# Patient Record
Sex: Male | Born: 1950 | Race: Black or African American | Hispanic: No | Marital: Single | Smoking: Former smoker
Health system: Southern US, Community
[De-identification: ages and names within clinical notes are randomized; demographics above are authoritative.]

## PROBLEM LIST (undated history)

## (undated) DIAGNOSIS — C259 Malignant neoplasm of pancreas, unspecified: Secondary | ICD-10-CM

## (undated) DIAGNOSIS — C3491 Malignant neoplasm of unspecified part of right bronchus or lung: Secondary | ICD-10-CM

## (undated) DIAGNOSIS — R17 Unspecified jaundice: Secondary | ICD-10-CM

## (undated) DIAGNOSIS — R918 Other nonspecific abnormal finding of lung field: Secondary | ICD-10-CM

## (undated) DIAGNOSIS — K639 Disease of intestine, unspecified: Secondary | ICD-10-CM

## (undated) HISTORY — DX: Malignant neoplasm of pancreas, unspecified: C25.9

## (undated) HISTORY — DX: Malignant neoplasm of unspecified part of right bronchus or lung: C34.91

---

## 2017-01-21 ENCOUNTER — Encounter (HOSPITAL_COMMUNITY): Payer: Self-pay | Admitting: Family Medicine

## 2017-01-21 ENCOUNTER — Ambulatory Visit (HOSPITAL_COMMUNITY)
Admission: EM | Admit: 2017-01-21 | Discharge: 2017-01-21 | Disposition: A | Discharge status: Home / Self Care | Payer: BC Managed Care – PPO | Attending: Internal Medicine | Admitting: Internal Medicine

## 2017-01-21 DIAGNOSIS — K5900 Constipation, unspecified: Secondary | ICD-10-CM

## 2017-01-21 DIAGNOSIS — R1033 Periumbilical pain: Secondary | ICD-10-CM

## 2017-01-21 MED ORDER — POLYETHYLENE GLYCOL 3350 17 GM/SCOOP PO POWD: 1.0000 | Freq: Once | ORAL | 0 refills | Status: AC

## 2017-01-21 NOTE — ED Triage Notes (Signed)
Pt here for abd pain and constipation. Reports that he had a BM about 9 am and was very small amount. Denies N,V,D

## 2017-01-21 NOTE — Discharge Instructions (Signed)
Please establish with a primary care provider for continued evaluation of umbilical pain as further imaging or work up may be necessary if it does not resolve. If it becomes larger, more painful, red, or develop nausea, vomiting, fevers please return to be seen. Please try miralax powder daily until bowel movements have become soft and regular for you. May titrate as needed.

## 2017-01-21 NOTE — ED Provider Notes (Signed)
[place]    CSN: [id number] Arrival date & time: 01/21/17  1203     History   Chief Complaint Chief Complaint  Patient presents with  . Abdominal Pain  . Constipation    HPI Fred Bradford is a 66 y.o. male.   Fred Bradford presents with complaints of difficulty passing stool and pain near his umbilicus. He noted the area to his umbilicus approximately 4 days ago when he was pushing on his stomach. He feels there was an area of protrusion, which becomes irritated if his pants are on it. He states he has had to really strain to pass a BM for the past 6 days. Did have a BM today but states it was hard and small amount. Denies current pain. Denies nausea, vomiting or diarrhea. Denies any medical history, does not smoke, does not take any medications. Denies previous similar. Denies urinary symptoms.    ROS per HPI.       History reviewed. No pertinent past medical history.  There are no active problems to display for this patient.   History reviewed. No pertinent surgical history.     Home Medications    Prior to Admission medications   Medication Sig Start Date End Date Taking? Authorizing Provider  polyethylene glycol powder (MIRALAX) powder Take 255 g once for 1 dose by mouth. 01/21/17 01/21/17  Zigmund Gottron, NP    Family History Family History  Family history unknown: Yes    Social History Social History   Tobacco Use  . Smoking status: Former Research scientist (life sciences)  . Smokeless tobacco: Never Used  Substance Use Topics  . Alcohol use: Not on file  . Drug use: Not on file     Allergies   Patient has no known allergies.   Review of Systems Review of Systems   Physical Exam Triage Vital Signs ED Triage Vitals  Enc Vitals Group     BP 01/21/17 1235 (!) 148/79     Pulse Rate 01/21/17 1235 77     Resp 01/21/17 1235 18     Temp 01/21/17 1235 98.6 F (37 C)     Temp src --      SpO2 01/21/17 1235 100 %     Weight --      Height --    Head Circumference --      Peak Flow --      Pain Score 01/21/17 1232 4     Pain Loc --      Pain Edu? --      Excl. in [place]? --    No data found.  Updated Vital Signs BP (!) 148/79   Pulse 77   Temp 98.6 F (37 C)   Resp 18   SpO2 100%   Visual Acuity Right Eye Distance:   Left Eye Distance:   Bilateral Distance:    Right Eye Near:   Left Eye Near:    Bilateral Near:     Physical Exam  Constitutional: He is oriented to person, place, and time. He appears well-developed and well-nourished.  Cardiovascular: Normal rate and regular rhythm.  Pulmonary/Chest: Effort normal and breath sounds normal.  Abdominal: Normal appearance. There is no tenderness.    Small soft mass noted with deep palpation; denies tenderness; right of midline; does not reduce with pressure  Neurological: He is alert and oriented to person, place, and time.  Skin: Skin is warm and dry.     UC Treatments / Results  Labs (all labs ordered  are listed, but only abnormal results are displayed) Labs Reviewed - No data to display  EKG  EKG Interpretation None       Radiology No results found.  Procedures Procedures (including critical care time)  Medications Ordered in UC Medications - No data to display   Initial Impression / Assessment and Plan / UC Course  I have reviewed the triage vital signs and the nursing notes.  Pertinent labs & imaging results that were available during my care of the patient were reviewed by me and considered in my medical decision making (see chart for details).     Umbilical region with small soft palpable mass noted, non tender; without redness. Discussed hernia vs mass vs natural body habitus as patient states he has just not noticed this before. Recommended establish with PCP for continued monitoring of this area, as may need further imaging in the future if it continues to be uncomfortable or if it worsens. miralax to help with constipation, push fluids.  Patient verbalized understanding and agreeable to plan.    Final Clinical Impressions(s) / UC Diagnoses   Final diagnoses:  Constipation, unspecified constipation type  Umbilical pain    ED Discharge Orders        Ordered    polyethylene glycol powder (MIRALAX) powder   Once     01/21/17 1252       Controlled Substance Prescriptions [place] Controlled Substance Registry consulted? Not Applicable   Zigmund Gottron, NP 01/21/17 1310

## 2017-02-15 ENCOUNTER — Other Ambulatory Visit: Payer: Self-pay

## 2017-02-15 ENCOUNTER — Inpatient Hospital Stay (HOSPITAL_COMMUNITY): Payer: BC Managed Care – PPO

## 2017-02-15 ENCOUNTER — Encounter (HOSPITAL_COMMUNITY): Payer: Self-pay

## 2017-02-15 ENCOUNTER — Emergency Department (HOSPITAL_COMMUNITY): Payer: BC Managed Care – PPO

## 2017-02-15 ENCOUNTER — Observation Stay (HOSPITAL_COMMUNITY)
Admission: EM | Admit: 2017-02-15 | Discharge: 2017-02-17 | Disposition: A | Discharge status: Home / Self Care | Payer: BC Managed Care – PPO | Attending: Family Medicine | Admitting: Family Medicine

## 2017-02-15 DIAGNOSIS — R1084 Generalized abdominal pain: Secondary | ICD-10-CM

## 2017-02-15 DIAGNOSIS — M419 Scoliosis, unspecified: Secondary | ICD-10-CM | POA: Insufficient documentation

## 2017-02-15 DIAGNOSIS — Z87891 Personal history of nicotine dependence: Secondary | ICD-10-CM | POA: Insufficient documentation

## 2017-02-15 DIAGNOSIS — K8689 Other specified diseases of pancreas: Secondary | ICD-10-CM | POA: Diagnosis present

## 2017-02-15 DIAGNOSIS — R0989 Other specified symptoms and signs involving the circulatory and respiratory systems: Secondary | ICD-10-CM

## 2017-02-15 DIAGNOSIS — Z79899 Other long term (current) drug therapy: Secondary | ICD-10-CM | POA: Diagnosis not present

## 2017-02-15 DIAGNOSIS — K5900 Constipation, unspecified: Secondary | ICD-10-CM | POA: Insufficient documentation

## 2017-02-15 DIAGNOSIS — G4700 Insomnia, unspecified: Secondary | ICD-10-CM | POA: Diagnosis not present

## 2017-02-15 DIAGNOSIS — K869 Disease of pancreas, unspecified: Principal | ICD-10-CM | POA: Insufficient documentation

## 2017-02-15 DIAGNOSIS — K862 Cyst of pancreas: Secondary | ICD-10-CM | POA: Insufficient documentation

## 2017-02-15 DIAGNOSIS — I700 Atherosclerosis of aorta: Secondary | ICD-10-CM | POA: Diagnosis not present

## 2017-02-15 DIAGNOSIS — M5136 Other intervertebral disc degeneration, lumbar region: Secondary | ICD-10-CM | POA: Diagnosis not present

## 2017-02-15 DIAGNOSIS — N281 Cyst of kidney, acquired: Secondary | ICD-10-CM | POA: Insufficient documentation

## 2017-02-15 LAB — COMPREHENSIVE METABOLIC PANEL
ALBUMIN: 3.8 g/dL (ref 3.5–5.0)
ALT: 39 U/L (ref 17–63)
AST: 31 U/L (ref 15–41)
Alkaline Phosphatase: 125 U/L (ref 38–126)
Anion gap: 7 (ref 5–15)
BUN: 17 mg/dL (ref 6–20)
CHLORIDE: 102 mmol/L (ref 101–111)
CO2: 27 mmol/L (ref 22–32)
Calcium: 9.3 mg/dL (ref 8.9–10.3)
Creatinine, Ser: 0.94 mg/dL (ref 0.61–1.24)
GFR calc Af Amer: >60 mL/min (ref >60)
GFR calc non Af Amer: >60 mL/min (ref >60)
GLUCOSE: 116 mg/dL — AB (ref 65–99)
POTASSIUM: 3.6 mmol/L (ref 3.5–5.1)
SODIUM: 136 mmol/L (ref 135–145)
Total Bilirubin: 0.7 mg/dL (ref 0.3–1.2)
Total Protein: 7.6 g/dL (ref 6.5–8.1)

## 2017-02-15 LAB — CBC
HEMATOCRIT: 42.1 % (ref 39.0–52.0)
Hemoglobin: 14 g/dL (ref 13.0–17.0)
MCH: 28.6 pg (ref 26.0–34.0)
MCHC: 33.3 g/dL (ref 30.0–36.0)
MCV: 85.9 fL (ref 78.0–100.0)
Platelets: 350 10*3/uL (ref 150–400)
RBC: 4.9 MIL/uL (ref 4.22–5.81)
RDW: 14 % (ref 11.5–15.5)
WBC: 8.3 10*3/uL (ref 4.0–10.5)

## 2017-02-15 LAB — URINALYSIS, ROUTINE W REFLEX MICROSCOPIC
BACTERIA UA: NONE SEEN
Bilirubin Urine: NEGATIVE
Glucose, UA: NEGATIVE mg/dL
HGB URINE DIPSTICK: NEGATIVE
KETONES UR: NEGATIVE mg/dL
LEUKOCYTES UA: NEGATIVE
Nitrite: NEGATIVE
PROTEIN: 30 mg/dL — AB
Specific Gravity, Urine: 1.023 (ref 1.005–1.030)
pH: 6 (ref 5.0–8.0)

## 2017-02-15 LAB — LIPASE, BLOOD: LIPASE: 31 U/L (ref 11–51)

## 2017-02-15 MED ORDER — ACETAMINOPHEN 650 MG RE SUPP: 650.0000 mg | Freq: Four times a day (QID) | RECTAL | Status: DC | PRN

## 2017-02-15 MED ORDER — ACETAMINOPHEN 325 MG PO TABS
650.0000 mg | ORAL_TABLET | Freq: Four times a day (QID) | ORAL | Status: DC | PRN
Start: 2017-02-15 — End: 2017-02-17
  Administered 2017-02-16: 650 mg via ORAL
  Filled 2017-02-15: qty 2

## 2017-02-15 MED ORDER — IOPAMIDOL (ISOVUE-300) INJECTION 61%
INTRAVENOUS | Status: AC
  Administered 2017-02-15: 100 mL via INTRAVENOUS
  Filled 2017-02-15: qty 100

## 2017-02-15 MED ORDER — SENNA 8.6 MG PO TABS
1.0000 | ORAL_TABLET | Freq: Two times a day (BID) | ORAL | Status: DC
  Administered 2017-02-15 – 2017-02-17 (×4): 8.6 mg via ORAL
  Filled 2017-02-15 (×4): qty 1

## 2017-02-15 MED ORDER — POLYETHYLENE GLYCOL 3350 17 G PO PACK: 17.0000 g | PACK | Freq: Every day | ORAL | Status: DC | PRN

## 2017-02-15 MED ORDER — ENOXAPARIN SODIUM 30 MG/0.3ML ~~LOC~~ SOLN
30.0000 mg | SUBCUTANEOUS | Status: DC
  Filled 2017-02-15: qty 0.3

## 2017-02-15 MED ORDER — RAMELTEON 8 MG PO TABS
8.0000 mg | ORAL_TABLET | Freq: Every day | ORAL | Status: DC
  Administered 2017-02-15 – 2017-02-16 (×2): 8 mg via ORAL
  Filled 2017-02-15 (×3): qty 1

## 2017-02-15 MED ORDER — MORPHINE SULFATE (PF) 4 MG/ML IV SOLN
4.0000 mg | Freq: Once | INTRAVENOUS | Status: AC
  Administered 2017-02-15: 4 mg via INTRAVENOUS
  Filled 2017-02-15: qty 1

## 2017-02-15 MED ORDER — ONDANSETRON HCL 4 MG PO TABS: 4.0000 mg | ORAL_TABLET | Freq: Four times a day (QID) | ORAL | Status: DC | PRN

## 2017-02-15 MED ORDER — IBUPROFEN 600 MG PO TABS: 600.0000 mg | ORAL_TABLET | Freq: Four times a day (QID) | ORAL | Status: DC | PRN

## 2017-02-15 MED ORDER — ONDANSETRON HCL 4 MG/2ML IJ SOLN: 4.0000 mg | Freq: Four times a day (QID) | INTRAMUSCULAR | Status: DC | PRN

## 2017-02-15 MED ORDER — DICYCLOMINE HCL 10 MG PO CAPS
10.0000 mg | ORAL_CAPSULE | Freq: Once | ORAL | Status: AC
  Administered 2017-02-15: 10 mg via ORAL
  Filled 2017-02-15: qty 1

## 2017-02-15 MED ORDER — GADOBENATE DIMEGLUMINE 529 MG/ML IV SOLN
14.0000 mL | Freq: Once | INTRAVENOUS | Status: AC
  Administered 2017-02-15: 14 mL via INTRAVENOUS

## 2017-02-15 MED ORDER — ENOXAPARIN SODIUM 40 MG/0.4ML ~~LOC~~ SOLN
40.0000 mg | SUBCUTANEOUS | Status: DC
  Administered 2017-02-15: 40 mg via SUBCUTANEOUS
  Filled 2017-02-15: qty 0.4

## 2017-02-15 MED ORDER — SODIUM CHLORIDE 0.9 % IV BOLUS (SEPSIS)
1000.0000 mL | Freq: Once | INTRAVENOUS | Status: AC
  Administered 2017-02-15: 1000 mL via INTRAVENOUS

## 2017-02-15 NOTE — ED Provider Notes (Signed)
Emergency Department Provider Note   I have reviewed the triage vital signs and the nursing notes.   HISTORY  Chief Complaint Abdominal Pain   HPI Fred Bradford is a 66 y.o. male since to the emergency department for evaluation of abdominal pain.  Symptoms have been ongoing for the past month.  He describes worsening pain.  Pain is located near the umbilicus.  No fevers or chills.  Patient reports unintentional weight loss. No radiation of symptoms. No modifying factors.    History reviewed. No pertinent past medical history.  Patient Active Problem List   Diagnosis Date Noted  . Pancreatic mass 02/15/2017    History reviewed. No pertinent surgical history.    Allergies Patient has no known allergies.  Family History  Family history unknown: Yes    Social History Social History   Tobacco Use  . Smoking status: Former Research scientist (life sciences)  . Smokeless tobacco: Never Used  Substance Use Topics  . Alcohol use: Yes  . Drug use: No    Review of Systems  Constitutional: No fever/chills Eyes: No visual changes. ENT: No sore throat. Cardiovascular: Denies chest pain. Respiratory: Denies shortness of breath. Gastrointestinal: Positive abdominal pain.  No nausea, no vomiting.  No diarrhea.  No constipation. Genitourinary: Negative for dysuria. Musculoskeletal: Negative for back pain. Skin: Negative for rash. Neurological: Negative for headaches, focal weakness or numbness.  10-point ROS otherwise negative.  ____________________________________________   PHYSICAL EXAM:  VITAL SIGNS: ED Triage Vitals  Enc Vitals Group     BP 02/15/17 1224 95/61     Pulse Rate 02/15/17 1224 (!) 120     Resp 02/15/17 1224 18     Temp 02/15/17 1224 98.8 F (37.1 C)     Temp Source 02/15/17 1224 Oral     SpO2 02/15/17 1224 99 %     Weight 02/15/17 1225 145 lb (65.8 kg)     Height 02/15/17 1225 5\' 9"  (1.753 m)     Pain Score 02/15/17 1224 6   Constitutional: Alert and oriented.  Well appearing and in no acute distress. Eyes: Conjunctivae are normal. Head: Atraumatic. Nose: No congestion/rhinnorhea. Mouth/Throat: Mucous membranes are moist. Neck: No stridor.  Cardiovascular: Normal rate, regular rhythm. Good peripheral circulation. Grossly normal heart sounds.   Respiratory: Normal respiratory effort.  No retractions. Lungs CTAB. Gastrointestinal: Soft with focal periumbilical abdominal tenderness. No rebound or guarding. No distention.  Musculoskeletal: No lower extremity tenderness nor edema. No gross deformities of extremities. Neurologic:  Normal speech and language. No gross focal neurologic deficits are appreciated.  Skin:  Skin is warm, dry and intact. No rash noted.  ____________________________________________   LABS (all labs ordered are listed, but only abnormal results are displayed)  Labs Reviewed  COMPREHENSIVE METABOLIC PANEL - Abnormal; Notable for the following components:      Result Value   Glucose, Bld 116 (*)    All other components within normal limits  URINALYSIS, ROUTINE W REFLEX MICROSCOPIC - Abnormal; Notable for the following components:   Color, Urine AMBER (*)    APPearance HAZY (*)    Protein, ur 30 (*)    Squamous Epithelial / LPF 0-5 (*)    All other components within normal limits  COMPREHENSIVE METABOLIC PANEL - Abnormal; Notable for the following components:   Sodium 133 (*)    Glucose, Bld 109 (*)    Calcium 8.8 (*)    Total Protein 6.3 (*)    Albumin 3.2 (*)    All other components within  normal limits  CBC - Abnormal; Notable for the following components:   Hemoglobin 12.9 (*)    All other components within normal limits  LIPASE, BLOOD  CBC   ____________________________________________  RADIOLOGY  X-ray Chest Pa And Lateral  Result Date: 02/15/2017 CLINICAL DATA:  Bilateral lower lobe ronchi EXAM: CHEST  2 VIEW COMPARISON:  None. FINDINGS: The heart size and mediastinal contours are within normal limits.  Aortic atherosclerosis is noted at the arch. The lungs are mildly hyperinflated without pulmonary consolidation, CHF nor effusion. Minimal scarring at the left lung apex. The visualized skeletal structures are unremarkable. IMPRESSION: No active cardiopulmonary disease. Mild pulmonary hyperinflation. Aortic atherosclerosis. Electronically Signed   By: Ashley Royalty M.D.   On: 02/15/2017 22:43   Mr Abdomen W Wo Contrast  Result Date: 02/16/2017 CLINICAL DATA:  Paraumbilical pain and anorexia. Pancreatic lesion seen on previous CT. EXAM: MRI ABDOMEN WITHOUT AND WITH CONTRAST TECHNIQUE: Multiplanar multisequence MR imaging of the abdomen was performed both before and after the administration of intravenous contrast. CONTRAST:  51mL MULTIHANCE GADOBENATE DIMEGLUMINE 529 MG/ML IV SOLN COMPARISON:  CT on 02/15/2017 FINDINGS: Lower Chest: No acute findings. Hepatobiliary: No intrahepatic masses are identified. Intrahepatic biliary ductal dilatation is seen within left hepatic lobe which appears to be due to lymphadenopathy in the porta hepatis. Pancreas: A ill-defined hypovascular mass with central necrosis is seen in body which measures approximately 4.3 x 2.9 cm on image 24/3. This causes pancreatic ductal dilatation pancreatic tail. 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 Spleen: Within normal limits in size and appearance. Adrenals/Urinary Tract: No masses identified. Several tiny renal cysts noted bilaterally. No evidence of hydronephrosis. Stomach/Bowel: No evidence of obstruction, inflammatory process or abnormal fluid collections. Vascular/Lymphatic: Peripancreatic lymphadenopathy seen in the gastrohepatic and hepatoduodenal ligaments, and extending into porta hepatis. The largest area in the porta hepatis measuring 2.9 cm short axis on image 33/ 1204. This causes obstruction of intrahepatic bile ducts within the left lobe. Other:  None. Musculoskeletal:  No  suspicious bone lesions identified. 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 Bulky peripancreatic adenopathy with extension into the porta hepatis. This causes obstruction of left intrahepatic bile ducts. Electronically Signed   By: Earle Gell M.D.   On: 02/16/2017 08:29   Ct Abdomen Pelvis W Contrast  Result Date: 02/15/2017 CLINICAL DATA:  Periumbilical abdominal pain, constipation and decreased appetite. EXAM: CT ABDOMEN AND PELVIS WITH CONTRAST TECHNIQUE: Multidetector CT imaging of the abdomen and pelvis was performed using the standard protocol following bolus administration of intravenous contrast. CONTRAST:  166mL ISOVUE-300 IOPAMIDOL (ISOVUE-300) INJECTION 61% COMPARISON:  None. FINDINGS: Lower chest: Focal calcifications in the posterior left hemithorax appear to abut the pleural surface and are associated with some mild soft tissue thickening. This likely represents partially calcified pleural plaque versus adjacent calcified granulomata. Hepatobiliary: The liver appears unremarkable. No focal hepatic mass lesions or evidence of biliary dilatation. The gallbladder is contracted. Pancreas: There is a mass within the body of the pancreas measuring approximately 3.8 x 2.7 x 3.6 cm. This mass causes obstruction of the splenic vein and also encases a segment of the splenic artery. The mass also exerts mass effect on the confluence of the superior mesenteric and portal veins. The pancreatic tail shows atrophy with associated ductal dilatation. Spleen: The spleen is normal in size. Perisplenic short gastric collateral veins are seen  which eventually drain into an epigastric collateral varix that drains into the superior mesenteric vein. 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 Adrenals/Urinary Tract: No adrenal masses  identified. The kidneys appear unremarkable and show no evidence of hydronephrosis, masses or calculi. Ureters and bladder are unremarkable. Stomach/Bowel: No evidence of bowel obstruction. The stomach is mildly distended with fluid. Vascular/Lymphatic: There is soft tissue nodularity superior to the splenic artery and medial to the stomach. This may represent an enlarged gastrohepatic lymph node. Margination of this soft tissue is very vague due to lack of abdominal fat. The abdominal aorta and common iliac artery is demonstrate scattered calcified plaque without evidence of aneurysmal disease. The aorta is moderately tortuous. Reproductive: Prostate is unremarkable. Other: There may be a trace amount of ascites in the peritoneal cavity. It is difficult to delineate free-fluid due to lack of intra-abdominal fat. Musculoskeletal: Degenerative disc disease of the lumbar spine present with a chronic appearing compression fracture of the L2 vertebral body. Associated scoliosis. IMPRESSION: 1. Mass within the body of the pancreas measuring approximately 3.8 cm in greatest diameter. 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 The pancreatic mass causes distal atrophy and ductal dilatation of the tail of the pancreas. There may be metastatic spread to a gastrohepatic lymph node measuring approximately 2 cm. Margination of this potential metastatic lymph node is very difficult due to the lack of intra-abdominal fat and further soft tissue delineation may be possible with MRI of the abdomen. 2. Probable trace ascites in the peritoneal cavity. 3. Small adjacent calcifications along the subpleural/pleural left posterior hemithorax has a benign appearance and represents either 2 adjacent calcified  granulomata or partially calcified pleural plaque. Electronically Signed   By: Aletta Edouard M.D.   On: 02/15/2017 15:49    ____________________________________________   PROCEDURES  Procedure(s) performed:   Procedures  None ____________________________________________   INITIAL IMPRESSION / ASSESSMENT AND PLAN / ED COURSE  Pertinent labs & imaging results that were available during my care of the patient were reviewed by me and considered in my medical decision making (see chart for details).  Patient presents to the ED for evaluation of abdominal pain, weight loss, and nausea. Normal labs from triage but given focal tenderness on exam and uinintentional weight loss the patient was sent for CT which showed a pancreatic mass extending into the splenic and hepatic vasculature. Pain not well controlled in the ED so provided morphine. Patient with little to no consistent PCP follow up for outpatient w/u. Plan for admission for pain control and MRI of the abdomen to r/o hepatic obstruction/venous thrombosis.   Discussed patient's case with Family Medicine to request admission. Patient and family (if present) updated with plan. Care transferred to [facility] Medicine service.  I reviewed all nursing notes, vitals, pertinent old records, EKGs, labs, imaging (as available).  ____________________________________________  FINAL CLINICAL IMPRESSION(S) / ED DIAGNOSES  Final diagnoses:  Generalized abdominal pain  Pancreatic mass     MEDICATIONS GIVEN DURING THIS VISIT:  Medications  acetaminophen (TYLENOL) tablet 650 mg (not administered)    Or  acetaminophen (TYLENOL) suppository 650 mg (not administered)  ibuprofen (ADVIL,MOTRIN) tablet 600 mg (not administered)  senna (SENOKOT) tablet 8.6 mg (8.6 mg Oral Given 02/15/17 2216)  polyethylene glycol (MIRALAX / GLYCOLAX) packet 17 g (not administered)  ondansetron (ZOFRAN) tablet 4 mg (not administered)    Or  ondansetron (ZOFRAN)  injection 4 mg (not administered)  ramelteon (ROZEREM) tablet 8 mg (8 mg Oral Given 02/15/17 2216)  enoxaparin (LOVENOX) injection 40 mg (40 mg Subcutaneous Given 02/15/17 2216)  sodium chloride 0.9 % bolus 1,000 mL (0 mLs Intravenous Stopped 02/15/17 1637)  dicyclomine (BENTYL) capsule 10 mg (10 mg Oral Given 02/15/17 1452)  iopamidol (ISOVUE-300) 61 % injection (100 mLs Intravenous Contrast Given 02/15/17 1513)  morphine 4 MG/ML injection 4 mg (4 mg Intravenous Given 02/15/17 1618)  gadobenate dimeglumine (MULTIHANCE) injection 14 mL (14 mLs Intravenous Contrast Given 02/15/17 2230)    Note:  This document was prepared using Dragon voice recognition software and may include unintentional dictation errors.  Nanda Quinton, MD Emergency Medicine    Dearies Meikle, [facility], MD 02/16/17 [phone]

## 2017-02-15 NOTE — ED Notes (Signed)
Pt ambulated to room from waiting room, tolerated well. 

## 2017-02-15 NOTE — H&P (Signed)
[facility] Admission History and Physical Service Pager: [phone]  Patient name: Fred Bradford Medical record number: [id number] Date of birth: June 09, 1950 Age: 66 y.o. Gender: male  Primary Care Provider: Care, Jinny Blossom [facility] Consultants: Oncology Code Status: FULL  Chief Complaint: abdominal pain  Assessment and Plan: Fred Bradford is a 66 y.o. male presenting with abdominal pain. No significant PMH.   Abdominal Pain/Pancreatic Mass: Etiology is most likely pancreatic cancer. Patient is having vague abdominal symptoms possibly because it has been discovered early in the disease process. His abdominal discomfort and weight loss is consistent with pancreatic cancer. Other possible causes are pancreatic cyst or metastatic disease that originates from another source. These are highly unlikely due to the fact that the mass appears to be solid on CT scan and is invading the splenic vein and likely the artery as well. He also appears to have gastrohepatic lymph node involvement. Given the findings on CT it is less likely a cyst. Unlikely to be metastatic due to the fact that most cancers do not invade the pancreas without spreading to other sites first. However, it should be noted patient was a long time smoker (approximately 40 years) and has never had a colonoscopy. Although patient is having relatively benign symptoms and very few complaints overall prognosis is very poor given these recent findings. Lipase WNL and vital signs stable. Pain currently well-controlled after one dose morphine in ED.  - Admit to med-surg, attending physician Dr. Ardelia Mems - MRI of Abdomen and Pelvis - Consult Oncology in AM - Tylenol and Ibuprofen first line for abdominal pain contol. If pain continues to be uncontrolled consider Ketoralac, next step to tramadol. Consider morphine/opioid equivalents if these measures fail to control pain. Would like to avoid opioids as patient  has complained of constipation in the recent past. - Zofran PRN as needed for development of n/v - Senna and Miralax PRN constipation - Remain NPO while awaiting MRI  Tobacco Abuse Disorder: Patient expresses long history of tobacco use. Cigarettes. He states he quit smoking in 2017 after using "the gum". - Continue to monitor and order nicotine patch as needed  Insomnia: Patient endorses 3 weeks of difficulty sleeping since the abdominal discomfort began. He states he can lay down but he really doesn't sleep b/c it keeps him up at night. - Remelteon for sleep at night - Consider trazodone if no improvement but preferably use of hypnotics can be avoided.  FEN/GI: NPO Prophylaxis: Lovenox  Disposition: admit to FPTS  History of Present Illness:  Fred Bradford is a 66 y.o. male presenting with abdominal pain.  Patient presents with abdominal discomfort for about the past 5 weeks. Describes initially as an "uneasy feeling" when eating. Went to an urgent care facility on 11/15 after noticing a "bump" in his abdomen and continued discomfort. Says he was referred to a specialist, but was told there were no available appts until 12/21. Was also given Miralax at urgent care due to constipation, which he said was not effective.  Discomfort continued to worsen, to the point where patient decreased his PO intake due to "fear of getting full." Pain became so uncomfortable at night that he has not been able to sleep for about three weeks. 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 Noticed that he began to lose weight, he thinks about 10 pounds. He bought mag citrate at the pharmacy which he reports did improve his constipation, though he is still endorsing  constipation. Denies vomiting, early satiety, nausea, fevers, chills. Reports improvement in pain when applying pressure to mass.    Review Of Systems: Per HPI with the following additions:   Review of Systems   Constitutional: Positive for weight loss. Negative for chills and fever.  Respiratory: Negative for shortness of breath.   Cardiovascular: Negative for chest pain.  Gastrointestinal: Positive for abdominal pain and constipation. Negative for blood in stool, diarrhea, melena and vomiting.  Genitourinary: Negative for dysuria.  Neurological: Negative for dizziness and headaches.    Patient Active Problem List   Diagnosis Date Noted  . Pancreatic mass 02/15/2017    Past Medical History: History reviewed. No pertinent past medical history.  Past Surgical History: History reviewed. No pertinent surgical history.  Social History: Social History   Tobacco Use  . Smoking status: Former Research scientist (life sciences)  . Smokeless tobacco: Never Used  Substance Use Topics  . Alcohol use: Yes  . Drug use: No   Additional social history: Lives with his aunt. Works as a Development worker, international aid at [employer].  Former smoker. Quit 07/2015. Began smoking prior to age 14yo. ~5 beers/wk. No illicit drug use.  Please also refer to relevant sections of EMR.  Family History: Family History  Family history unknown: Yes   Denies any pertinent family medical history. No family history of cancer.   Allergies and Medications: No Known Allergies No current facility-administered medications on file prior to encounter.    Current Outpatient Medications on File Prior to Encounter  Medication Sig Dispense Refill  . magnesium citrate SOLN Take 1 Bottle by mouth once as needed for moderate constipation.      Objective: BP 119/65   Pulse 77   Temp 98.8 F (37.1 C) (Oral)   Resp 17   Ht 5\' 9"  (1.753 m)   Wt 145 lb (65.8 kg)   SpO2 100%   BMI 21.41 kg/m  Exam: General: Alert and Oriented x 3; NAD HEENT: NCAT, EOMI, PERRLA, non-erythematous moist pharyngeal mucosa, no exudates Cardiovascular: RRR, normal S1, S2, no murmurs Respiratory: rhonchorus breath sounds in lower lung bases with mild end-expiratory wheezes bilaterally, no  crackles Gastrointestinal: non-distended, soft, non-tender, +bs in all quadrants, no hepatomegaly MSK: FROM in all extremities Derm: warm, dry, intact, no rashes Neuro: CN II-XII grossly intact  Labs and Imaging: CBC BMET  Recent Labs  Lab 02/15/17 1232  WBC 8.3  HGB 14.0  HCT 42.1  PLT 350   Recent Labs  Lab 02/15/17 1232  NA 136  K 3.6  CL 102  CO2 27  BUN 17  CREATININE 0.94  GLUCOSE 116*  CALCIUM 9.3     12/10 - Lipase: 31 12/10 - U/A: 30mg /dL protein in urine, 0-5 squamous cells, hyaline casts; othewise wnl  Ct Abdomen Pelvis W Contrast  Result Date: 02/15/2017 CLINICAL DATA:  Periumbilical abdominal pain, constipation and decreased appetite. EXAM: CT ABDOMEN AND PELVIS WITH CONTRAST TECHNIQUE: Multidetector CT imaging of the abdomen and pelvis was performed using the standard protocol following bolus administration of intravenous contrast. CONTRAST:  122mL ISOVUE-300 IOPAMIDOL (ISOVUE-300) INJECTION 61% COMPARISON:  None. FINDINGS: Lower chest: Focal calcifications in the posterior left hemithorax appear to abut the pleural surface and are associated with some mild soft tissue thickening. This likely represents partially calcified pleural plaque versus adjacent calcified granulomata. Hepatobiliary: The liver appears unremarkable. No focal hepatic mass lesions or evidence of biliary dilatation. The gallbladder is contracted. Pancreas: There is a mass within the body of the pancreas measuring approximately 3.8  x 2.7 x 3.6 cm. This mass causes obstruction of the splenic vein and also encases a segment of the splenic artery. The mass also exerts mass effect on the confluence of the superior mesenteric and portal veins. The pancreatic tail shows atrophy with associated ductal dilatation. Spleen: The spleen is normal in size. Perisplenic short gastric collateral veins are seen which eventually drain into an epigastric collateral varix that drains into the superior mesenteric vein.  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 Adrenals/Urinary Tract: No adrenal masses identified. The kidneys appear unremarkable and show no evidence of hydronephrosis, masses or calculi. Ureters and bladder are unremarkable. Stomach/Bowel: No evidence of bowel obstruction. The stomach is mildly distended with fluid. Vascular/Lymphatic: There is soft tissue nodularity superior to the splenic artery and medial to the stomach. This may represent an enlarged gastrohepatic lymph node. Margination of this soft tissue is very vague due to lack of abdominal fat. The abdominal aorta and common iliac artery is demonstrate scattered calcified plaque without evidence of aneurysmal disease. The aorta is moderately tortuous. Reproductive: Prostate is unremarkable. Other: There may be a trace amount of ascites in the peritoneal cavity. It is difficult to delineate free-fluid due to lack of intra-abdominal fat. Musculoskeletal: Degenerative disc disease of the lumbar spine present with a chronic appearing compression fracture of the L2 vertebral body. Associated scoliosis. IMPRESSION: 1. Mass within the body of the pancreas measuring approximately 3.8 cm in greatest diameter. 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 The pancreatic mass causes distal atrophy and ductal dilatation of the tail of the pancreas. There may be metastatic spread to a gastrohepatic lymph node measuring approximately 2 cm. Margination of this potential metastatic lymph node is very difficult due to the lack of intra-abdominal fat and further soft tissue delineation may be possible with MRI of the abdomen. 2. Probable trace ascites in the peritoneal cavity. 3. Small  adjacent calcifications along the subpleural/pleural left posterior hemithorax has a benign appearance and represents either 2 adjacent calcified granulomata or partially calcified pleural plaque. Electronically Signed   By: Aletta Edouard M.D.   On: 02/15/2017 15:49    Nuala Alpha, DO 02/15/2017, 6:16 PM PGY-1, [facility] pager: [phone], text pages welcome  UPPER LEVEL ADDENDUM  I have read the above note and made revisions highlighted in orange.  [name], MD, MPH PGY-3 [facility] Pager [phone]

## 2017-02-15 NOTE — ED Triage Notes (Signed)
Per Pt, Pt is coming from home with R Mid abdominal pain that started at the beginning of November. Denies any N/V/D, urinary symptoms.

## 2017-02-16 ENCOUNTER — Other Ambulatory Visit: Payer: Self-pay

## 2017-02-16 ENCOUNTER — Telehealth: Payer: Self-pay | Admitting: Hematology

## 2017-02-16 DIAGNOSIS — K869 Disease of pancreas, unspecified: Principal | ICD-10-CM

## 2017-02-16 DIAGNOSIS — R1084 Generalized abdominal pain: Secondary | ICD-10-CM

## 2017-02-16 LAB — CBC
HCT: 39.3 % (ref 39.0–52.0)
HEMOGLOBIN: 12.9 g/dL — AB (ref 13.0–17.0)
MCH: 28.2 pg (ref 26.0–34.0)
MCHC: 32.8 g/dL (ref 30.0–36.0)
MCV: 86 fL (ref 78.0–100.0)
Platelets: 315 10*3/uL (ref 150–400)
RBC: 4.57 MIL/uL (ref 4.22–5.81)
RDW: 14.1 % (ref 11.5–15.5)
WBC: 7.6 10*3/uL (ref 4.0–10.5)

## 2017-02-16 LAB — COMPREHENSIVE METABOLIC PANEL
ALK PHOS: 116 U/L (ref 38–126)
ALT: 34 U/L (ref 17–63)
ANION GAP: 6 (ref 5–15)
AST: 30 U/L (ref 15–41)
Albumin: 3.2 g/dL — ABNORMAL LOW (ref 3.5–5.0)
BILIRUBIN TOTAL: 0.7 mg/dL (ref 0.3–1.2)
BUN: 12 mg/dL (ref 6–20)
CALCIUM: 8.8 mg/dL — AB (ref 8.9–10.3)
CO2: 25 mmol/L (ref 22–32)
Chloride: 102 mmol/L (ref 101–111)
Creatinine, Ser: 0.69 mg/dL (ref 0.61–1.24)
GFR calc non Af Amer: >60 mL/min (ref >60)
Glucose, Bld: 109 mg/dL — ABNORMAL HIGH (ref 65–99)
Potassium: 3.8 mmol/L (ref 3.5–5.1)
Sodium: 133 mmol/L — ABNORMAL LOW (ref 135–145)
TOTAL PROTEIN: 6.3 g/dL — AB (ref 6.5–8.1)

## 2017-02-16 MED ORDER — ENSURE ENLIVE PO LIQD
237.0000 mL | Freq: Three times a day (TID) | ORAL | Status: DC
  Administered 2017-02-16: 237 mL via ORAL

## 2017-02-16 MED ORDER — SODIUM CHLORIDE 0.9 % IV SOLN
INTRAVENOUS | Status: DC
  Administered 2017-02-16 – 2017-02-17 (×2): via INTRAVENOUS

## 2017-02-16 NOTE — Progress Notes (Signed)
New Admission Note:  Arrival Method: Stretcher from ED Mental Orientation: A&O x4 Telemetry: N/A Assessment: Completed Skin: Assessed with Charito B., RN, check flowsheets IV: 20g Left Forearm Pain: 0/10 Tubes: N/A Safety Measures: Safety Fall Prevention Plan discussed with patient. Admission: Completed 2M Orientation: Patient has been orientated to the room, unit and the staff. Family: None at beside.  Orders have been reviewed and implemented. Will continue to monitor the patient. Call light has been placed within reach.  Nena Polio BSN, RN  Phone Number: [phone]

## 2017-02-16 NOTE — H&P (View-Only) (Signed)
[facility] Consult: 9:41 AM 02/16/2017  LOS: 1 day    Referring Provider: Dr Ardelia Mems  Primary Care Physician:  Care, Jinny Blossom [facility] Primary Gastroenterologist:  unassigned     Reason for Consultation:  Pancreatic mass   HPI: Fred Bradford is a 66 y.o. male.  Previously very healthy, does not have any regular medical care or health care provider.  No previous surgeries. Beginning in early November he started to have vague abdominal discomfort, not pain.  He lost his appetite, had early satiety.  Bowel habits became irregular changing from daily BMs to occurring every 2-3 days.  He noticed lumps near his bellybutton.  Waistband of his pants would irritate this.  On 11/15 he was seen at [facility] urgent care where a periumbilical, soft mass was noted on exam.  No imaging or lab work was obtained.  The nurse practitioner provider at urgent care recommended establishing with a PCP for further monitoring of the area and further imaging if felt warranted.  He had an appointment on 12/21 with the [facility] to establish care.  Lactulose was prescribed for his constipation, he took this but it did not change his bowel habits significantly.  The abdominal discomfort progressed to abdominal pain and it reached a point where he return to the ED yesterday for further care.  CT ab/pelvis with contrast: 3.8 cm pancreatic mass in body.  Invading/occluding splenic vein and splenic artery with collateral flow to gastric collaterals and anterior abdominal varix draining to SMA.  Possible spread to gastrohepatic nodes.  LFTs not elevated.  No significant anemia.    Patient had a good bowel movement this morning after taking Senokot last night along with the oral contrast consumed for the CT scan.   Abdominal pain has improved after the bowel movement. Patient denies nausea or vomiting.  He estimates about a 10 pound weight loss within the last month and he was already quite thin.  He weighs 145# and his BMI is 21.  Not been using acetaminophen, NSAIDs, aspirin products.  Occasionally drinks a beer or 2.  Does not smoke or chew tobacco.  Family history is negative for any cancers, pancreatic, liver, intestinal, gastric diseases    History reviewed. No pertinent past medical history.  History reviewed. No pertinent surgical history.  Prior to Admission medications   Medication Sig Start Date End Date Taking? Authorizing Provider  magnesium citrate SOLN Take 1 Bottle by mouth once as needed for moderate constipation.   Yes [provider]    Scheduled Meds: . enoxaparin (LOVENOX) injection  40 mg Subcutaneous Q24H  . ramelteon  8 mg Oral QHS  . senna  1 tablet Oral BID   Infusions:  PRN Meds: acetaminophen **OR** acetaminophen, ibuprofen, ondansetron **OR** ondansetron (ZOFRAN) IV, polyethylene glycol   Allergies as of 02/15/2017  . (No Known Allergies)    Family History  Family history unknown: Yes    Social History   Socioeconomic History  . Marital status: Single    Spouse name: Not on file  .  Number of children: Not on file  . Years of education: Not on file  . Highest education level: Not on file  Social Needs  . Financial resource strain: Not on file  . Food insecurity - worry: Not on file  . Food insecurity - inability: Not on file  . Transportation needs - medical: Not on file  . Transportation needs - non-medical: Not on file  Occupational History  . Not on file  Tobacco Use  . Smoking status: Former Research scientist (life sciences)  . Smokeless tobacco: Never Used  Substance and Sexual Activity  . Alcohol use: Yes  . Drug use: No  . Sexual activity: Not on file  Other Topics Concern  . Not on file  Social History Narrative  . 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  No cough. CV:  No palpitations, no LE edema.  No chest pain GU:  No hematuria, no frequency GI: No dysphagia.  No history of heartburn or reflux symptoms. Heme: No unusual bleeding or bruising. Transfusions: Never transfused with blood products. Neuro: + insomnia.  No headaches, no peripheral tingling or numbness Derm:  No itching, no rash or sores.  Endocrine:  No sweats or chills.  No polyuria or dysuria Immunization: Did not inquire as to previous immunizations. Travel:  None beyond local counties in last few months.    PHYSICAL EXAM: Vital signs in last 24 hours: Vitals:   02/16/17 0512 02/16/17 0904  BP: 139/82 109/65  Pulse: 76 64  Resp: 16 17  Temp: 98 F (36.7 C) 98 F (36.7 C)  SpO2: 99% 100%   Wt Readings from Last 3 Encounters:  02/16/17 65.9 kg (145 lb 3.5 oz)    General: Other than looking quite thin, he looks well.  I would not describe him as cachectic due to the fact that he is quite vibrant and strong appearing. Head: No facial asymmetry or swelling.  Prominent facial bony structure Eyes: No scleral icterus, no conjunctival pallor.  EOMI. Ears: No obvious hearing loss. Nose: No congestion or discharge Mouth: Missing teeth, healthy, pink, clear oral mucosa.  Tongue midline. Neck: Prominent veins on the right side of his neck which are not seen on the left neck Lungs: Clear bilaterally.  No cough or labored breathing Heart: RRR.  No MRG.  S1, S2 present. Abdomen: Soft.  Not tender or distended.  Firm nodules up to 1.5 cm in size in the periumbilical region.  No HSM.Marland Kitchen   Rectal: Deferred rectal exam. Musc/Skeltl: No gross joint swelling or deformities.  Looks wiry and strong. Extremities: No CCE. Neurologic: Alert.  Oriented x3.  No tremor.  No limb weakness. Skin: No jaundice.  No rashes or  sores.  No purpura/bruising Nodes: Periumbilical adenopathy Psych: Pleasant, calm, cooperative.  Good historian.  Fluid speech.  Intake/Output from previous day: 12/10 0701 - 12/11 0700 In: 2360 [P.O.:360; I.V.:1000; IV Piggyback:1000] Out: 375 [Urine:375] Intake/Output this shift: Total I/O In: 240 [P.O.:240] Out: 0   LAB RESULTS: Recent Labs    02/15/17 1232 02/16/17 0532  WBC 8.3 7.6  HGB 14.0 12.9*  HCT 42.1 39.3  PLT 350 315   BMET Lab Results  Component Value Date   NA 133 (L) 02/16/2017   NA 136 02/15/2017   K 3.8 02/16/2017   K 3.6 02/15/2017   CL  102 02/16/2017   CL 102 02/15/2017   CO2 25 02/16/2017   CO2 27 02/15/2017   GLUCOSE 109 (H) 02/16/2017   GLUCOSE 116 (H) 02/15/2017   BUN 12 02/16/2017   BUN 17 02/15/2017   CREATININE 0.69 02/16/2017   CREATININE 0.94 02/15/2017   CALCIUM 8.8 (L) 02/16/2017   CALCIUM 9.3 02/15/2017   LFT Recent Labs    02/15/17 1232 02/16/17 0532  PROT 7.6 6.3*  ALBUMIN 3.8 3.2*  AST 31 30  ALT 39 34  ALKPHOS 125 116  BILITOT 0.7 0.7   PT/INR No results found for: INR, PROTIME Hepatitis Panel No results for input(s): HEPBSAG, HCVAB, HEPAIGM, HEPBIGM in the last 72 hours. C-Diff No components found for: CDIFF Lipase     Component Value Date/Time   LIPASE 31 02/15/2017 1232    Drugs of Abuse  No results found for: LABOPIA, COCAINSCRNUR, LABBENZ, AMPHETMU, THCU, LABBARB   RADIOLOGY STUDIES: X-ray Chest Pa And Lateral  Result Date: 02/15/2017 CLINICAL DATA:  Bilateral lower lobe ronchi EXAM: CHEST  2 VIEW COMPARISON:  None. FINDINGS: The heart size and mediastinal contours are within normal limits. Aortic atherosclerosis is noted at the arch. The lungs are mildly hyperinflated without pulmonary consolidation, CHF nor effusion. Minimal scarring at the left lung apex. The visualized skeletal structures are unremarkable. IMPRESSION: No active cardiopulmonary disease. Mild pulmonary hyperinflation. Aortic  atherosclerosis. Electronically Signed   By: Ashley Royalty M.D.   On: 02/15/2017 22:43   Mr Abdomen W Wo Contrast  Result Date: 02/16/2017 CLINICAL DATA:  Paraumbilical pain and anorexia. Pancreatic lesion seen on previous CT. EXAM: MRI ABDOMEN WITHOUT AND WITH CONTRAST TECHNIQUE: Multiplanar multisequence MR imaging of the abdomen was performed both before and after the administration of intravenous contrast. CONTRAST:  60mL MULTIHANCE GADOBENATE DIMEGLUMINE 529 MG/ML IV SOLN COMPARISON:  CT on 02/15/2017 FINDINGS: Lower Chest: No acute findings. Hepatobiliary: No intrahepatic masses are identified. Intrahepatic biliary ductal dilatation is seen within left hepatic lobe which appears to be due to lymphadenopathy in the porta hepatis. Pancreas: A ill-defined hypovascular mass with central necrosis is seen in body which measures approximately 4.3 x 2.9 cm on image 24/3. This causes pancreatic ductal dilatation pancreatic tail. 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 Spleen: Within normal limits in size and appearance. Adrenals/Urinary Tract: No masses identified. Several tiny renal cysts noted bilaterally. No evidence of hydronephrosis. Stomach/Bowel: No evidence of obstruction, inflammatory process or abnormal fluid collections. Vascular/Lymphatic: Peripancreatic lymphadenopathy seen in the gastrohepatic and hepatoduodenal ligaments, and extending into porta hepatis. The largest area in the porta hepatis measuring 2.9 cm short axis on image 33/ 1204. This causes obstruction of intrahepatic bile ducts within the left lobe. Other:  None. Musculoskeletal:  No suspicious bone lesions identified. 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 Bulky peripancreatic adenopathy with extension into the porta hepatis. This causes obstruction of left  intrahepatic bile ducts. Electronically Signed   By: Earle Gell M.D.   On: 02/16/2017 08:29   Ct Abdomen Pelvis W Contrast  Result Date: 02/15/2017 CLINICAL DATA:  Periumbilical abdominal pain, constipation and decreased appetite. EXAM: CT ABDOMEN AND PELVIS WITH CONTRAST TECHNIQUE: Multidetector CT imaging of the abdomen and pelvis was performed using the standard protocol following bolus administration of intravenous contrast. CONTRAST:  170mL ISOVUE-300 IOPAMIDOL (ISOVUE-300) INJECTION 61% COMPARISON:  None. FINDINGS: Lower chest: Focal calcifications  in the posterior left hemithorax appear to abut the pleural surface and are associated with some mild soft tissue thickening. This likely represents partially calcified pleural plaque versus adjacent calcified granulomata. Hepatobiliary: The liver appears unremarkable. No focal hepatic mass lesions or evidence of biliary dilatation. The gallbladder is contracted. Pancreas: There is a mass within the body of the pancreas measuring approximately 3.8 x 2.7 x 3.6 cm. This mass causes obstruction of the splenic vein and also encases a segment of the splenic artery. The mass also exerts mass effect on the confluence of the superior mesenteric and portal veins. The pancreatic tail shows atrophy with associated ductal dilatation. Spleen: The spleen is normal in size. Perisplenic short gastric collateral veins are seen which eventually drain into an epigastric collateral varix that drains into the superior mesenteric vein. 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 Adrenals/Urinary Tract: No adrenal masses identified. The kidneys appear unremarkable and show no evidence of hydronephrosis, masses or calculi. Ureters and bladder are unremarkable. Stomach/Bowel: No evidence of bowel obstruction. The stomach is mildly distended with fluid. Vascular/Lymphatic: There is soft tissue nodularity superior to the  splenic artery and medial to the stomach. This may represent an enlarged gastrohepatic lymph node. Margination of this soft tissue is very vague due to lack of abdominal fat. The abdominal aorta and common iliac artery is demonstrate scattered calcified plaque without evidence of aneurysmal disease. The aorta is moderately tortuous. Reproductive: Prostate is unremarkable. Other: There may be a trace amount of ascites in the peritoneal cavity. It is difficult to delineate free-fluid due to lack of intra-abdominal fat. Musculoskeletal: Degenerative disc disease of the lumbar spine present with a chronic appearing compression fracture of the L2 vertebral body. Associated scoliosis. IMPRESSION: 1. Mass within the body of the pancreas measuring approximately 3.8 cm in greatest diameter. 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 The pancreatic mass causes distal atrophy and ductal dilatation of the tail of the pancreas. There may be metastatic spread to a gastrohepatic lymph node measuring approximately 2 cm. Margination of this potential metastatic lymph node is very difficult due to the lack of intra-abdominal fat and further soft tissue delineation may be possible with MRI of the abdomen. 2. Probable trace ascites in the peritoneal cavity. 3. Small adjacent calcifications along the subpleural/pleural left posterior hemithorax has a benign appearance and represents either 2 adjacent calcified granulomata or partially calcified pleural plaque. Electronically Signed   By: Aletta Edouard M.D.   On: 02/15/2017 15:49     IMPRESSION:   *  Pancreatic mass, certainly a cancer.  Invasion to splenic vein and artery and leading to collateral flow to short gastric collateral with a resulting      PLAN:     *   Dr. Owens Loffler feels he could obtain a FNA biopsy via EUS.  This is arranged as oupt for Thursday 12/13 for 1:30 PM.  Should arrive by 11:30 to admitting atWL hospital, npo.  Pt can discharge today or tomorrow, so long as he has adequate pain control and effective stool regimen for his constipation.     *   Oncology prefers to see pt in clinic after biopsy obtained and path confirmed.     Azucena Freed  02/16/2017, 9:41 AM Pager: [phone]

## 2017-02-16 NOTE — Progress Notes (Signed)
Oncology consult received and discussed with the requesting physician.  Imaging studies suggest advanced pancreatic malignancy although no tissue biopsy has been obtained.  He is scheduled to have a biopsy done on 02/18/2017 via EUS.  We will arrange an outpatient oncology follow-up approximately 1 week after the procedure to ensure the results are available at the time.  This was discussed with the patient today and he is aware he will receive a call from the [facility] for an appointment next week

## 2017-02-16 NOTE — Progress Notes (Signed)
Initial Nutrition Assessment  DOCUMENTATION CODES:   Severe malnutrition in context of chronic illness  INTERVENTION:   Ensure Enlive po TID, each supplement provides 350 kcal and 20 grams of protein  NUTRITION DIAGNOSIS:   Severe Malnutrition related to chronic illness(advanced pancreatitic malignancy) as evidenced by percent weight loss, severe fat depletion, severe muscle depletion.  GOAL:   Patient will meet greater than or equal to 90% of their needs  MONITOR:   PO intake, Supplement acceptance, Labs, Weight trends  REASON FOR ASSESSMENT:   Malnutrition Screening Tool    ASSESSMENT:   66 yo male admitted with stomach pain, pancreatic mass which is most likely cancer. Pt with no PMH  1 month ago, pt starting experiencing anorexia, early satiety and abdominal pain. Pt reports he was constipated frequently which also discouraged him from eating. Pt only able to eat small amounts before feeling full. Pt has not been taking oral nutritional supplements at home but agreeable to starting one today.   Oncology following, per MD note, imaging studies suggest advanced pancreatic malignancy  Pt reports 10 pound weight loss in 1 month (6.5% wt loss which is significant for time frame)  Pt reports constipation (pt did have a BM within the last 24 hours). Pt denies N/V/D  NUTRITION - FOCUSED PHYSICAL EXAM:    Most Recent Value  Orbital Region  Severe depletion  Upper Arm Region  Severe depletion  Thoracic and Lumbar Region  Severe depletion  Buccal Region  Moderate depletion  Temple Region  Severe depletion  Clavicle Bone Region  Severe depletion  Clavicle and Acromion Bone Region  Severe depletion  Scapular Bone Region  Moderate depletion  Dorsal Hand  Mild depletion  Patellar Region  Severe depletion  Anterior Thigh Region  Severe depletion  Posterior Calf Region  Severe depletion  Edema (RD Assessment)  None  Hair  Reviewed  Eyes  Reviewed  Mouth  Reviewed  Skin   Reviewed  Nails  Reviewed       Diet Order:  Diet regular Room service appropriate? Yes; Fluid consistency: Thin  EDUCATION NEEDS:   No education needs have been identified at this time  Skin:  Skin Assessment: Reviewed RN Assessment  Last BM:  12/10  Height:   Ht Readings from Last 1 Encounters:  02/15/17 5\' 9"  (1.753 m)    Weight:   Wt Readings from Last 1 Encounters:  02/16/17 145 lb 3.5 oz (65.9 kg)    Ideal Body Weight:  72.7 kg  BMI:  Body mass index is 21.44 kg/m.  Estimated Nutritional Needs:   Kcal:  5701-7793 kcals  Protein:  99-116 g  Fluid:  >/= 2 L  Kerman Passey MS, RD, LDN, CNSC [phone] Pager  [phone] Weekend/On-Call Pager

## 2017-02-16 NOTE — Consult Note (Signed)
[facility] Consult: 9:41 AM 02/16/2017  LOS: 1 day    Referring Provider: Dr Ardelia Mems  Primary Care Physician:  Care, Jinny Blossom Total Access Primary Gastroenterologist:  unassigned     Reason for Consultation:  Pancreatic mass   HPI: Fred Bradford is a 66 y.o. male.  Previously very healthy, does not have any regular medical care or health care provider.  No previous surgeries. Beginning in early November he started to have vague abdominal discomfort, not pain.  He lost his appetite, had early satiety.  Bowel habits became irregular changing from daily BMs to occurring every 2-3 days.  He noticed lumps near his bellybutton.  Waistband of his pants would irritate this.  On 11/15 he was seen at [facility] urgent care where a periumbilical, soft mass was noted on exam.  No imaging or lab work was obtained.  The nurse practitioner provider at urgent care recommended establishing with a PCP for further monitoring of the area and further imaging if felt warranted.  He had an appointment on 12/21 with the [facility] to establish care.  Lactulose was prescribed for his constipation, he took this but it did not change his bowel habits significantly.  The abdominal discomfort progressed to abdominal pain and it reached a point where he return to the ED yesterday for further care.  CT ab/pelvis with contrast: 3.8 cm pancreatic mass in body.  Invading/occluding splenic vein and splenic artery with collateral flow to gastric collaterals and anterior abdominal varix draining to SMA.  Possible spread to gastrohepatic nodes.  LFTs not elevated.  No significant anemia.    Patient had a good bowel movement this morning after taking Senokot last night along with the oral contrast consumed for the CT scan.   Abdominal pain has improved after the bowel movement. Patient denies nausea or vomiting.  He estimates about a 10 pound weight loss within the last month and he was already quite thin.  He weighs 145# and his BMI is 21.  Not been using acetaminophen, NSAIDs, aspirin products.  Occasionally drinks a beer or 2.  Does not smoke or chew tobacco.  Family history is negative for any cancers, pancreatic, liver, intestinal, gastric diseases    History reviewed. No pertinent past medical history.  History reviewed. No pertinent surgical history.  Prior to Admission medications   Medication Sig Start Date End Date Taking? Authorizing Provider  magnesium citrate SOLN Take 1 Bottle by mouth once as needed for moderate constipation.   Yes [provider]    Scheduled Meds: . enoxaparin (LOVENOX) injection  40 mg Subcutaneous Q24H  . ramelteon  8 mg Oral QHS  . senna  1 tablet Oral BID   Infusions:  PRN Meds: acetaminophen **OR** acetaminophen, ibuprofen, ondansetron **OR** ondansetron (ZOFRAN) IV, polyethylene glycol   Allergies as of 02/15/2017  . (No Known Allergies)    Family History  Family history unknown: Yes    Social History   Socioeconomic History  . Marital status: Single    Spouse name: Not on file  .  Number of children: Not on file  . Years of education: Not on file  . Highest education level: Not on file  Social Needs  . Financial resource strain: Not on file  . Food insecurity - worry: Not on file  . Food insecurity - inability: Not on file  . Transportation needs - medical: Not on file  . Transportation needs - non-medical: Not on file  Occupational History  . Not on file  Tobacco Use  . Smoking status: Former Research scientist (life sciences)  . Smokeless tobacco: Never Used  Substance and Sexual Activity  . Alcohol use: Yes  . Drug use: No  . Sexual activity: Not on file  Other Topics Concern  . Not on file  Social History Narrative  . 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  No cough. CV:  No palpitations, no LE edema.  No chest pain GU:  No hematuria, no frequency GI: No dysphagia.  No history of heartburn or reflux symptoms. Heme: No unusual bleeding or bruising. Transfusions: Never transfused with blood products. Neuro: + insomnia.  No headaches, no peripheral tingling or numbness Derm:  No itching, no rash or sores.  Endocrine:  No sweats or chills.  No polyuria or dysuria Immunization: Did not inquire as to previous immunizations. Travel:  None beyond local counties in last few months.    PHYSICAL EXAM: Vital signs in last 24 hours: Vitals:   02/16/17 0512 02/16/17 0904  BP: 139/82 109/65  Pulse: 76 64  Resp: 16 17  Temp: 98 F (36.7 C) 98 F (36.7 C)  SpO2: 99% 100%   Wt Readings from Last 3 Encounters:  02/16/17 65.9 kg (145 lb 3.5 oz)    General: Other than looking quite thin, he looks well.  I would not describe him as cachectic due to the fact that he is quite vibrant and strong appearing. Head: No facial asymmetry or swelling.  Prominent facial bony structure Eyes: No scleral icterus, no conjunctival pallor.  EOMI. Ears: No obvious hearing loss. Nose: No congestion or discharge Mouth: Missing teeth, healthy, pink, clear oral mucosa.  Tongue midline. Neck: Prominent veins on the right side of his neck which are not seen on the left neck Lungs: Clear bilaterally.  No cough or labored breathing Heart: RRR.  No MRG.  S1, S2 present. Abdomen: Soft.  Not tender or distended.  Firm nodules up to 1.5 cm in size in the periumbilical region.  No HSM.Marland Kitchen   Rectal: Deferred rectal exam. Musc/Skeltl: No gross joint swelling or deformities.  Looks wiry and strong. Extremities: No CCE. Neurologic: Alert.  Oriented x3.  No tremor.  No limb weakness. Skin: No jaundice.  No rashes or  sores.  No purpura/bruising Nodes: Periumbilical adenopathy Psych: Pleasant, calm, cooperative.  Good historian.  Fluid speech.  Intake/Output from previous day: 12/10 0701 - 12/11 0700 In: 2360 [P.O.:360; I.V.:1000; IV Piggyback:1000] Out: 375 [Urine:375] Intake/Output this shift: Total I/O In: 240 [P.O.:240] Out: 0   LAB RESULTS: Recent Labs    02/15/17 1232 02/16/17 0532  WBC 8.3 7.6  HGB 14.0 12.9*  HCT 42.1 39.3  PLT 350 315   BMET Lab Results  Component Value Date   NA 133 (L) 02/16/2017   NA 136 02/15/2017   K 3.8 02/16/2017   K 3.6 02/15/2017   CL  102 02/16/2017   CL 102 02/15/2017   CO2 25 02/16/2017   CO2 27 02/15/2017   GLUCOSE 109 (H) 02/16/2017   GLUCOSE 116 (H) 02/15/2017   BUN 12 02/16/2017   BUN 17 02/15/2017   CREATININE 0.69 02/16/2017   CREATININE 0.94 02/15/2017   CALCIUM 8.8 (L) 02/16/2017   CALCIUM 9.3 02/15/2017   LFT Recent Labs    02/15/17 1232 02/16/17 0532  PROT 7.6 6.3*  ALBUMIN 3.8 3.2*  AST 31 30  ALT 39 34  ALKPHOS 125 116  BILITOT 0.7 0.7   PT/INR No results found for: INR, PROTIME Hepatitis Panel No results for input(s): HEPBSAG, HCVAB, HEPAIGM, HEPBIGM in the last 72 hours. C-Diff No components found for: CDIFF Lipase     Component Value Date/Time   LIPASE 31 02/15/2017 1232    Drugs of Abuse  No results found for: LABOPIA, COCAINSCRNUR, LABBENZ, AMPHETMU, THCU, LABBARB   RADIOLOGY STUDIES: X-ray Chest Pa And Lateral  Result Date: 02/15/2017 CLINICAL DATA:  Bilateral lower lobe ronchi EXAM: CHEST  2 VIEW COMPARISON:  None. FINDINGS: The heart size and mediastinal contours are within normal limits. Aortic atherosclerosis is noted at the arch. The lungs are mildly hyperinflated without pulmonary consolidation, CHF nor effusion. Minimal scarring at the left lung apex. The visualized skeletal structures are unremarkable. IMPRESSION: No active cardiopulmonary disease. Mild pulmonary hyperinflation. Aortic  atherosclerosis. Electronically Signed   By: Ashley Royalty M.D.   On: 02/15/2017 22:43   Mr Abdomen W Wo Contrast  Result Date: 02/16/2017 CLINICAL DATA:  Paraumbilical pain and anorexia. Pancreatic lesion seen on previous CT. EXAM: MRI ABDOMEN WITHOUT AND WITH CONTRAST TECHNIQUE: Multiplanar multisequence MR imaging of the abdomen was performed both before and after the administration of intravenous contrast. CONTRAST:  69mL MULTIHANCE GADOBENATE DIMEGLUMINE 529 MG/ML IV SOLN COMPARISON:  CT on 02/15/2017 FINDINGS: Lower Chest: No acute findings. Hepatobiliary: No intrahepatic masses are identified. Intrahepatic biliary ductal dilatation is seen within left hepatic lobe which appears to be due to lymphadenopathy in the porta hepatis. Pancreas: A ill-defined hypovascular mass with central necrosis is seen in body which measures approximately 4.3 x 2.9 cm on image 24/3. This causes pancreatic ductal dilatation pancreatic tail. 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 Spleen: Within normal limits in size and appearance. Adrenals/Urinary Tract: No masses identified. Several tiny renal cysts noted bilaterally. No evidence of hydronephrosis. Stomach/Bowel: No evidence of obstruction, inflammatory process or abnormal fluid collections. Vascular/Lymphatic: Peripancreatic lymphadenopathy seen in the gastrohepatic and hepatoduodenal ligaments, and extending into porta hepatis. The largest area in the porta hepatis measuring 2.9 cm short axis on image 33/ 1204. This causes obstruction of intrahepatic bile ducts within the left lobe. Other:  None. Musculoskeletal:  No suspicious bone lesions identified. 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 Bulky peripancreatic adenopathy with extension into the porta hepatis. This causes obstruction of left  intrahepatic bile ducts. Electronically Signed   By: Earle Gell M.D.   On: 02/16/2017 08:29   Ct Abdomen Pelvis W Contrast  Result Date: 02/15/2017 CLINICAL DATA:  Periumbilical abdominal pain, constipation and decreased appetite. EXAM: CT ABDOMEN AND PELVIS WITH CONTRAST TECHNIQUE: Multidetector CT imaging of the abdomen and pelvis was performed using the standard protocol following bolus administration of intravenous contrast. CONTRAST:  114mL ISOVUE-300 IOPAMIDOL (ISOVUE-300) INJECTION 61% COMPARISON:  None. FINDINGS: Lower chest: Focal calcifications  in the posterior left hemithorax appear to abut the pleural surface and are associated with some mild soft tissue thickening. This likely represents partially calcified pleural plaque versus adjacent calcified granulomata. Hepatobiliary: The liver appears unremarkable. No focal hepatic mass lesions or evidence of biliary dilatation. The gallbladder is contracted. Pancreas: There is a mass within the body of the pancreas measuring approximately 3.8 x 2.7 x 3.6 cm. This mass causes obstruction of the splenic vein and also encases a segment of the splenic artery. The mass also exerts mass effect on the confluence of the superior mesenteric and portal veins. The pancreatic tail shows atrophy with associated ductal dilatation. Spleen: The spleen is normal in size. Perisplenic short gastric collateral veins are seen which eventually drain into an epigastric collateral varix that drains into the superior mesenteric vein. 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 Adrenals/Urinary Tract: No adrenal masses identified. The kidneys appear unremarkable and show no evidence of hydronephrosis, masses or calculi. Ureters and bladder are unremarkable. Stomach/Bowel: No evidence of bowel obstruction. The stomach is mildly distended with fluid. Vascular/Lymphatic: There is soft tissue nodularity superior to the  splenic artery and medial to the stomach. This may represent an enlarged gastrohepatic lymph node. Margination of this soft tissue is very vague due to lack of abdominal fat. The abdominal aorta and common iliac artery is demonstrate scattered calcified plaque without evidence of aneurysmal disease. The aorta is moderately tortuous. Reproductive: Prostate is unremarkable. Other: There may be a trace amount of ascites in the peritoneal cavity. It is difficult to delineate free-fluid due to lack of intra-abdominal fat. Musculoskeletal: Degenerative disc disease of the lumbar spine present with a chronic appearing compression fracture of the L2 vertebral body. Associated scoliosis. IMPRESSION: 1. Mass within the body of the pancreas measuring approximately 3.8 cm in greatest diameter. 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 The pancreatic mass causes distal atrophy and ductal dilatation of the tail of the pancreas. There may be metastatic spread to a gastrohepatic lymph node measuring approximately 2 cm. Margination of this potential metastatic lymph node is very difficult due to the lack of intra-abdominal fat and further soft tissue delineation may be possible with MRI of the abdomen. 2. Probable trace ascites in the peritoneal cavity. 3. Small adjacent calcifications along the subpleural/pleural left posterior hemithorax has a benign appearance and represents either 2 adjacent calcified granulomata or partially calcified pleural plaque. Electronically Signed   By: Aletta Edouard M.D.   On: 02/15/2017 15:49     IMPRESSION:   *  Pancreatic mass, certainly a cancer.  Invasion to splenic vein and artery and leading to collateral flow to short gastric collateral with a resulting      PLAN:     *   Dr. Owens Loffler feels he could obtain a FNA biopsy via EUS.  This is arranged as oupt for Thursday 12/13 for 1:30 PM.  Should arrive by 11:30 to admitting atWL hospital, npo.  Pt can discharge today or tomorrow, so long as he has adequate pain control and effective stool regimen for his constipation.     *   Oncology prefers to see pt in clinic after biopsy obtained and path confirmed.     Azucena Freed  02/16/2017, 9:41 AM Pager: [phone]

## 2017-02-16 NOTE — Telephone Encounter (Signed)
Spoke with patient re hosp fu 12/18 @ 1:30 pm. Message from YF and FS.

## 2017-02-16 NOTE — Progress Notes (Signed)
Family Medicine Teaching Service Daily Progress Note Intern Pager: [phone]  Patient name: Fred Bradford Medical record number: [id number] Date of birth: 10/24/1950 Age: 66 y.o. Gender: male  Primary Care Provider: Care, Jinny Blossom Total Access Consultants: Oncology Code Status: Full  Pt Overview and Major Events to Date:  Patient admitted with abdominal pain and pancreatic mass  Assessment and Plan:  Abdominal pain/pancreatic mass: Etiology is most likely pancreatic mass cancer.  Patient with abdominal discomfort, weight loss, constipation which is consistent with pancreatic cancer.  Other possible cause with her pancreatic cyst or metastatic disease originating from another source.  These are highly unlikely due to mass appearing to be solid on CT and invading the splenic vein and likely the artery as well. Unlikely to be metastatic due to the fact that most cancers do not invade the pancreas without spreading to other sites first. Lipase WNL and vital signs stable.  Pain currently well controlled after Tylenol and ibuprofen overnight.  -MRI of abdomen and pelvis: Showing 4.3 cm mass in the pancreatic body, consistent with pancreatic carcinoma.  The mass encases the proximal splenic artery, and because of splenic vein thrombosis.  Also bulky peripancreatic adenopathy with extension into the porta hepatis.  This causes obstruction of left intrahepatic bile ducts. -Oncology consulted -GI consulted for biopsy- scheduled for Thursday at [facility] -Pain well-controlled with Tylenol and ibuprofen -Zofran PRN for development of nausea and vomiting -Senna and MiraLAX as needed for constipation-patient reported bm overnight  Tobacco abuse disorder: P patient expresses long history of tobacco use.  States he quit smoking in 2017. -Order nicotine patch as needed  Insomnia: Patient endorses 3 weeks of difficulty sleeping since abdominal discomfort began. -Ramelteon for sleep at  night -Consider trazodone if improvement  FEN/GI: NPO Prophylaxis: Lovenox  Disposition: Stable  Subjective:  Patient is lying in bed and states that his pain is well controlled.  He states that he had a bowel movement last night which is helped with his abdominal discomfort.  He is anxious to speak with oncologist in regards to his plan for the future.  Patient is also interested in having a PCP set up for him.  Patient states that he has lost 10 pounds over the past month.  Objective: Temp:  [98 F (36.7 C)-98.8 F (37.1 C)] 98 F (36.7 C) (12/11 0512) Pulse Rate:  [66-120] 76 (12/11 0512) Resp:  [16-18] 16 (12/11 0512) BP: (95-139)/(61-82) 139/82 (12/11 0512) SpO2:  [95 %-100 %] 99 % (12/11 0512) Weight:  [145 lb (65.8 kg)-145 lb 3.5 oz (65.9 kg)] 145 lb 3.5 oz (65.9 kg) (12/11 1751) Physical Exam: General: NAD, resting in bed comfortably Cardiovascular: RRR, normal S1, S2, no MRG Respiratory: Rhonchorous breath sounds in lower lung bases with no wheezing noted, no crackles, normal work of breathing Gastrointestinal: Nondistended, soft, nontender, positive bowel sounds in all quadrants, mass noted near the umbilicus which is tender to palpation MSK: Moves all extremities equally Psych: AAO x3, appropriate affect  Laboratory: Recent Labs  Lab 02/15/17 1232 02/16/17 0532  WBC 8.3 7.6  HGB 14.0 12.9*  HCT 42.1 39.3  PLT 350 315   Recent Labs  Lab 02/15/17 1232 02/16/17 0532  NA 136 133*  K 3.6 3.8  CL 102 102  CO2 27 25  BUN 17 12  CREATININE 0.94 0.69  CALCIUM 9.3 8.8*  PROT 7.6 6.3*  BILITOT 0.7 0.7  ALKPHOS 125 116  ALT 39 34  AST 31 30  GLUCOSE 116* 109*  Lipase 31  Imaging/Diagnostic Tests: X-ray Chest Pa And Lateral  Result Date: 02/15/2017 CLINICAL DATA:  Bilateral lower lobe ronchi EXAM: CHEST  2 VIEW COMPARISON:  None. FINDINGS: The heart size and mediastinal contours are within normal limits. Aortic atherosclerosis is noted at the arch. The  lungs are mildly hyperinflated without pulmonary consolidation, CHF nor effusion. Minimal scarring at the left lung apex. The visualized skeletal structures are unremarkable. IMPRESSION: No active cardiopulmonary disease. Mild pulmonary hyperinflation. Aortic atherosclerosis. Electronically Signed   By: Ashley Royalty M.D.   On: 02/15/2017 22:43   Mr Abdomen W Wo Contrast  Result Date: 02/16/2017 CLINICAL DATA:  Paraumbilical pain and anorexia. Pancreatic lesion seen on previous CT. EXAM: MRI ABDOMEN WITHOUT AND WITH CONTRAST TECHNIQUE: Multiplanar multisequence MR imaging of the abdomen was performed both before and after the administration of intravenous contrast. CONTRAST:  44mL MULTIHANCE GADOBENATE DIMEGLUMINE 529 MG/ML IV SOLN COMPARISON:  CT on 02/15/2017 FINDINGS: Lower Chest: No acute findings. Hepatobiliary: No intrahepatic masses are identified. Intrahepatic biliary ductal dilatation is seen within left hepatic lobe which appears to be due to lymphadenopathy in the porta hepatis. Pancreas: A ill-defined hypovascular mass with central necrosis is seen in body which measures approximately 4.3 x 2.9 cm on image 24/3. This causes pancreatic ductal dilatation pancreatic tail. 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 Spleen: Within normal limits in size and appearance. Adrenals/Urinary Tract: No masses identified. Several tiny renal cysts noted bilaterally. No evidence of hydronephrosis. Stomach/Bowel: No evidence of obstruction, inflammatory process or abnormal fluid collections. Vascular/Lymphatic: Peripancreatic lymphadenopathy seen in the gastrohepatic and hepatoduodenal ligaments, and extending into porta hepatis. The largest area in the porta hepatis measuring 2.9 cm short axis on image 33/ 1204. This causes obstruction of intrahepatic bile ducts within the left lobe. Other:  None. Musculoskeletal:  No suspicious bone lesions identified. IMPRESSION: 4.3 cm  mass in the pancreatic body, consistent with pancreatic carcinoma. This mass encases the proximal splenic artery, and causes splenic vein thrombosis . Bulky peripancreatic adenopathy with extension into the porta hepatis. This causes obstruction of left intrahepatic bile ducts. Electronically Signed   By: Earle Gell M.D.   On: 02/16/2017 08:29   Ct Abdomen Pelvis W Contrast  Result Date: 02/15/2017 CLINICAL DATA:  Periumbilical abdominal pain, constipation and decreased appetite. EXAM: CT ABDOMEN AND PELVIS WITH CONTRAST TECHNIQUE: Multidetector CT imaging of the abdomen and pelvis was performed using the standard protocol following bolus administration of intravenous contrast. CONTRAST:  168mL ISOVUE-300 IOPAMIDOL (ISOVUE-300) INJECTION 61% COMPARISON:  None. FINDINGS: Lower chest: Focal calcifications in the posterior left hemithorax appear to abut the pleural surface and are associated with some mild soft tissue thickening. This likely represents partially calcified pleural plaque versus adjacent calcified granulomata. Hepatobiliary: The liver appears unremarkable. No focal hepatic mass lesions or evidence of biliary dilatation. The gallbladder is contracted. Pancreas: There is a mass within the body of the pancreas measuring approximately 3.8 x 2.7 x 3.6 cm. This mass causes obstruction of the splenic vein and also encases a segment of the splenic artery. The mass also exerts mass effect on the confluence of the superior mesenteric and portal veins. The pancreatic tail shows atrophy with associated ductal dilatation. Spleen: The spleen is normal in size. Perisplenic short gastric collateral veins are seen which eventually drain into an epigastric collateral varix that drains into the superior mesenteric vein. The collateral pathway has developed as a result of splenic vein thrombosis due to the pancreatic  mass. Adrenals/Urinary Tract: No adrenal masses identified. The kidneys appear unremarkable and show  no evidence of hydronephrosis, masses or calculi. Ureters and bladder are unremarkable. Stomach/Bowel: No evidence of bowel obstruction. The stomach is mildly distended with fluid. Vascular/Lymphatic: There is soft tissue nodularity superior to the splenic artery and medial to the stomach. This may represent an enlarged gastrohepatic lymph node. Margination of this soft tissue is very vague due to lack of abdominal fat. The abdominal aorta and common iliac artery is demonstrate scattered calcified plaque without evidence of aneurysmal disease. The aorta is moderately tortuous. Reproductive: Prostate is unremarkable. Other: There may be a trace amount of ascites in the peritoneal cavity. It is difficult to delineate free-fluid due to lack of intra-abdominal fat. Musculoskeletal: Degenerative disc disease of the lumbar spine present with a chronic appearing compression fracture of the L2 vertebral body. Associated scoliosis. IMPRESSION: 1. Mass within the body of the pancreas measuring approximately 3.8 cm in greatest diameter. 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 The pancreatic mass causes distal atrophy and ductal dilatation of the tail of the pancreas. There may be metastatic spread to a gastrohepatic lymph node measuring approximately 2 cm. Margination of this potential metastatic lymph node is very difficult due to the lack of intra-abdominal fat and further soft tissue delineation may be possible with MRI of the abdomen. 2. Probable trace ascites in the peritoneal cavity. 3. Small adjacent calcifications along the subpleural/pleural left posterior hemithorax has a benign appearance and represents either 2 adjacent calcified granulomata or partially calcified pleural plaque.  Electronically Signed   By: Aletta Edouard M.D.   On: 02/15/2017 15:49    Tyrez Berrios, [place], DO 02/16/2017, 8:17 AM PGY-1, [place] Intern pager: [phone], text pages welcome

## 2017-02-17 DIAGNOSIS — R1084 Generalized abdominal pain: Secondary | ICD-10-CM | POA: Diagnosis not present

## 2017-02-17 DIAGNOSIS — K869 Disease of pancreas, unspecified: Secondary | ICD-10-CM | POA: Diagnosis not present

## 2017-02-17 MED ORDER — OXYCODONE-ACETAMINOPHEN 5-325 MG PO TABS
1.0000 | ORAL_TABLET | Freq: Four times a day (QID) | ORAL | Status: DC | PRN
  Administered 2017-02-17: 1 via ORAL
  Filled 2017-02-17: qty 1

## 2017-02-17 MED ORDER — TRAZODONE HCL 50 MG PO TABS: 50.0000 mg | ORAL_TABLET | Freq: Every evening | ORAL | Status: DC | PRN

## 2017-02-17 MED ORDER — TRAZODONE HCL 50 MG PO TABS: 50.0000 mg | ORAL_TABLET | Freq: Every evening | ORAL | 0 refills | Status: DC | PRN

## 2017-02-17 MED ORDER — SENNA 8.6 MG PO TABS: 1.0000 | ORAL_TABLET | Freq: Two times a day (BID) | ORAL | 0 refills | Status: DC

## 2017-02-17 MED ORDER — OXYCODONE-ACETAMINOPHEN 5-325 MG PO TABS: 2.0000 | ORAL_TABLET | Freq: Four times a day (QID) | ORAL | 0 refills | Status: DC | PRN

## 2017-02-17 MED ORDER — POLYETHYLENE GLYCOL 3350 17 G PO PACK: 17.0000 g | PACK | Freq: Every day | ORAL | 0 refills | Status: DC | PRN

## 2017-02-17 MED ORDER — OXYCODONE-ACETAMINOPHEN 5-325 MG PO TABS
1.0000 | ORAL_TABLET | ORAL | Status: AC
  Administered 2017-02-17: 1 via ORAL
  Filled 2017-02-17: qty 1

## 2017-02-17 MED ORDER — OXYCODONE-ACETAMINOPHEN 5-325 MG PO TABS: 2.0000 | ORAL_TABLET | Freq: Four times a day (QID) | ORAL | Status: DC | PRN

## 2017-02-17 NOTE — Discharge Summary (Signed)
[facility] Discharge Summary  Patient name: Fred Bradford Medical record number: [id number] Date of birth: 1950/06/25 Age: 66 y.o. 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  Patient also reporting 10 pound weight loss.  CT abdomen and ED showed mass within the body of the pancreas measuring approximately 3.8 cm in greatest diameter.  Mass invades and includes the splenic vein and also likely invades the splenic artery.  There may be a metastatic spread of the gastrohepatic lymph node measuring approximately 2 cm.  MRI of abdomen and pelvis on day 2 of stay showed 4.3 cm mass in the pancreatic body, consistent with pancreatic carcinoma.  The mass encases the proximal splenic artery, and causes splenic vein thrombosis.  Bulky peripancreatic adenopathy with extension into the porta hepatis.  This causes obstruction of the left intrahepatic bile ducts.  Patient seen by oncology and gastroenterology while admitted.  Oncology recommended getting a biopsy of the mass.  Patient was scheduled at [facility] on 02/18/2017 for biopsy with gastroenterology.  Patient with increasing pain prior to discharge.  Reportedly well controlled on percocet prior to discharge. Patient had constipation prior to discharge but was controlled with senna, colace, and miralax prior to discharge. Patient also began experiencing insomnia recently. Not controlled while  admitted.  Issues for Follow Up:  1. Patient to get biopsy at [facility] 02/18/2017 at 1:30 pm. 2. Patient will need follow up with oncology. 3. Patient will need further assistance with pain management and constipation management after diagnosis of pancreatic cancer. 4. Patient experiencing insomnia and sent home with trazodone. Ensure this is working for him.  Significant Procedures: none  Significant Labs and Imaging:  Recent Labs  Lab 02/15/17 1232 02/16/17 0532  WBC 8.3 7.6  HGB 14.0 12.9*  HCT 42.1 39.3  PLT 350 315   Recent Labs  Lab 02/15/17 1232 02/16/17 0532  NA 136 133*  K 3.6 3.8  CL 102 102  CO2 27 25  GLUCOSE 116* 109*  BUN 17 12  CREATININE 0.94 0.69  CALCIUM 9.3 8.8*  ALKPHOS 125 116  AST 31 30  ALT 39 34  ALBUMIN 3.8 3.2*    Results/Tests Pending at Time of Discharge: none  Discharge Medications:  Allergies as of 02/17/2017   No Known Allergies     Medication List    TAKE these medications   magnesium citrate Soln Take 1 Bottle by mouth once as needed for moderate constipation.   oxyCODONE-acetaminophen 5-325 MG tablet Commonly known as:  PERCOCET/ROXICET Take 2 tablets by mouth every 6 (six) hours as needed for severe pain.   polyethylene glycol packet Commonly known as:  MIRALAX / GLYCOLAX Take 17 g by mouth daily as needed for mild constipation.   senna 8.6 MG Tabs tablet Commonly known as:  SENOKOT Take 1 tablet (8.6 mg total) by mouth 2 (two) times daily.   traZODone 50 MG tablet Commonly known as:  DESYREL Take 1 tablet (50 mg total) by mouth at bedtime as needed for sleep.  Discharge Instructions: Please refer to Patient Instructions section of EMR for full details.  Patient was counseled important signs and symptoms that should prompt return to medical care, changes in medications, dietary instructions, activity restrictions, and follow up appointments.   Follow-Up Appointments: Follow-up Information    [facility] Follow up.   Why:  go to admissions at [facility] at 11:30 on 12/13 for registration.  then you go to endoscopy unit for your procedure.  ok to have sips of clear liquids between midnight and 5AM on 12/13, but nothing by mouth after 5AM.   Contact information: [place] [address] [phone]       Verner Mould, MD. Go on 02/19/2017.   Specialty:  Family Medicine Why:  Your appointment is scheduled for 2:30 PM. Please arrive 15 minutes early.  Contact information: [address] [place] [address] [phone]           Kersti Scavone, [place], DO 02/18/2017, 12:02 PM PGY-1, [place]

## 2017-02-17 NOTE — Progress Notes (Signed)
Fred Bradford to be D/C'd Home per MD order.  Discussed prescriptions and follow up appointments with the patient. Prescriptions given to patient, medication list explained in detail. Pt verbalized understanding.  Allergies as of 02/17/2017   No Known Allergies     Medication List    TAKE these medications   magnesium citrate Soln Take 1 Bottle by mouth once as needed for moderate constipation.   oxyCODONE-acetaminophen 5-325 MG tablet Commonly known as:  PERCOCET/ROXICET Take 2 tablets by mouth every 6 (six) hours as needed for severe pain.   polyethylene glycol packet Commonly known as:  MIRALAX / GLYCOLAX Take 17 g by mouth daily as needed for mild constipation.   senna 8.6 MG Tabs tablet Commonly known as:  SENOKOT Take 1 tablet (8.6 mg total) by mouth 2 (two) times daily.   traZODone 50 MG tablet Commonly known as:  DESYREL Take 1 tablet (50 mg total) by mouth at bedtime as needed for sleep.       Vitals:   02/17/17 0523 02/17/17 0914  BP: 125/82 123/74  Pulse: 78 76  Resp: 12 15  Temp: 98.1 F (36.7 C) 98.5 F (36.9 C)  SpO2: 100% 98%    Skin clean, dry and intact without evidence of skin break down, no evidence of skin tears noted. IV catheter discontinued intact. Site without signs and symptoms of complications. Dressing and pressure applied. Pt denies pain at this time. No complaints noted.  An After Visit Summary was printed and given to the patient. Patient escorted via [place], and D/C home via private auto.  Chapman Fitch BSN, RN [facility] 5MW

## 2017-02-17 NOTE — Discharge Instructions (Addendum)
It was a pleasure caring for you while you were admitted! You were found to have a mass on your pancreas. It is important to get this biopsied.  Please follow up at [facility] tomorrow for your biopsy and with the Family Medicine clinic on Friday. We have given you percocet for pain and trazodone to help with sleep.

## 2017-02-17 NOTE — Progress Notes (Signed)
Family Medicine Teaching Service Daily Progress Note Intern Pager: [phone]  Patient name: Fred Bradford Medical record number: [id number] Date of birth: 03-15-1950 Age: 66 y.o. Gender: male  Primary Care Provider: Care, Jinny Blossom Total Access Consultants: Oncology Code Status: Full  Pt Overview and Major Events to Date:  Patient admitted with abdominal pain and pancreatic mass Found to have pancreatic carcinoma by MRI; will have biopsy on 12/13  Assessment and Plan:  Abdominal pain/pancreatic mass: Patient with worsening abdominal discomfort.  Constipation has improved as he has had 2 bowel movements overnight. MRI of abdomen and pelvis: Showing 4.3 cm mass in the pancreatic body, consistent with pancreatic carcinoma.  The mass encases the proximal splenic artery, and because of splenic vein thrombosis.  Also bulky peripancreatic adenopathy with extension into the porta hepatis.  This causes obstruction of left intrahepatic bile ducts. -Oncology has seen and will follow-up after biopsy -GI consulted for biopsy that is scheduled for Thursday at [facility] -Pain worsening, started Percocet in addition to ibuprofen and Tylenol; will need better management outpatient -S senna MiraLAX as needed for constipation  Tobacco abuse disorder: P patient expresses long history of tobacco use.  States he quit smoking in 2017. -Order nicotine patch as needed  Insomnia: Patient endorses 3 weeks of difficulty sleeping since abdominal discomfort began. -Ramelteon for sleep at night -Trazodone added due to no improvement from ramelteon  FEN/GI: NPO Prophylaxis: Lovenox  Disposition: Stable  Subjective:  Patient lying in bed and complaining of worsening abdominal pain.   Objective: Temp:  [98.1 F (36.7 C)-98.7 F (37.1 C)] 98.5 F (36.9 C) (12/12 0914) Pulse Rate:  [70-78] 76 (12/12 0914) Resp:  [12-17] 15 (12/12 0914) BP: (114-125)/(65-82) 123/74 (12/12 0914) SpO2:  [98 %-100 %]  98 % (12/12 0914) Physical Exam: General: NAD, lying in bed complaining of abdominal pain Cardiovascular: RRR, normal S1, S2, no MRG Respiratory: CTA BL, normal work of breathing Gastrointestinal: Minimal distention, soft, mildly tender, positive bowel sounds in all quadrants, mass noted under the umbilicus which is tender to palpation MSK: Moves all extremities equally Psych: AAO x3, appropriate affect  Laboratory: Recent Labs  Lab 02/15/17 1232 02/16/17 0532  WBC 8.3 7.6  HGB 14.0 12.9*  HCT 42.1 39.3  PLT 350 315   Recent Labs  Lab 02/15/17 1232 02/16/17 0532  NA 136 133*  K 3.6 3.8  CL 102 102  CO2 27 25  BUN 17 12  CREATININE 0.94 0.69  CALCIUM 9.3 8.8*  PROT 7.6 6.3*  BILITOT 0.7 0.7  ALKPHOS 125 116  ALT 39 34  AST 31 30  GLUCOSE 116* 109*   Lipase 31  Imaging/Diagnostic Tests: X-ray Chest Pa And Lateral  Result Date: 02/15/2017 CLINICAL DATA:  Bilateral lower lobe ronchi EXAM: CHEST  2 VIEW COMPARISON:  None. FINDINGS: The heart size and mediastinal contours are within normal limits. Aortic atherosclerosis is noted at the arch. The lungs are mildly hyperinflated without pulmonary consolidation, CHF nor effusion. Minimal scarring at the left lung apex. The visualized skeletal structures are unremarkable. IMPRESSION: No active cardiopulmonary disease. Mild pulmonary hyperinflation. Aortic atherosclerosis. Electronically Signed   By: Ashley Royalty M.D.   On: 02/15/2017 22:43   Mr Abdomen W Wo Contrast  Result Date: 02/16/2017 CLINICAL DATA:  Paraumbilical pain and anorexia. Pancreatic lesion seen on previous CT. EXAM: MRI ABDOMEN WITHOUT AND WITH CONTRAST TECHNIQUE: Multiplanar multisequence MR imaging of the abdomen was performed both before and after the administration of intravenous contrast. CONTRAST:  77mL MULTIHANCE GADOBENATE DIMEGLUMINE 529 MG/ML IV SOLN COMPARISON:  CT on 02/15/2017 FINDINGS: Lower Chest: No acute findings. Hepatobiliary: No intrahepatic  masses are identified. Intrahepatic biliary ductal dilatation is seen within left hepatic lobe which appears to be due to lymphadenopathy in the porta hepatis. Pancreas: A ill-defined hypovascular mass with central necrosis is seen in body which measures approximately 4.3 x 2.9 cm on image 24/3. This causes pancreatic ductal dilatation pancreatic tail. 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 Spleen: Within normal limits in size and appearance. Adrenals/Urinary Tract: No masses identified. Several tiny renal cysts noted bilaterally. No evidence of hydronephrosis. Stomach/Bowel: No evidence of obstruction, inflammatory process or abnormal fluid collections. Vascular/Lymphatic: Peripancreatic lymphadenopathy seen in the gastrohepatic and hepatoduodenal ligaments, and extending into porta hepatis. The largest area in the porta hepatis measuring 2.9 cm short axis on image 33/ 1204. This causes obstruction of intrahepatic bile ducts within the left lobe. Other:  None. Musculoskeletal:  No suspicious bone lesions identified. 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 Bulky peripancreatic adenopathy with extension into the porta hepatis. This causes obstruction of left intrahepatic bile ducts. Electronically Signed   By: Earle Gell M.D.   On: 02/16/2017 08:29   Ct Abdomen Pelvis W Contrast  Result Date: 02/15/2017 CLINICAL DATA:  Periumbilical abdominal pain, constipation and decreased appetite. EXAM: CT ABDOMEN AND PELVIS WITH CONTRAST TECHNIQUE: Multidetector CT imaging of the abdomen and pelvis was performed using the standard protocol following bolus administration of intravenous contrast. CONTRAST:  114mL ISOVUE-300 IOPAMIDOL (ISOVUE-300) INJECTION 61% COMPARISON:  None. FINDINGS: Lower chest: Focal calcifications in the posterior left  hemithorax appear to abut the pleural surface and are associated with some mild soft tissue thickening. This likely represents partially calcified pleural plaque versus adjacent calcified granulomata. Hepatobiliary: The liver appears unremarkable. No focal hepatic mass lesions or evidence of biliary dilatation. The gallbladder is contracted. Pancreas: There is a mass within the body of the pancreas measuring approximately 3.8 x 2.7 x 3.6 cm. This mass causes obstruction of the splenic vein and also encases a segment of the splenic artery. The mass also exerts mass effect on the confluence of the superior mesenteric and portal veins. The pancreatic tail shows atrophy with associated ductal dilatation. Spleen: The spleen is normal in size. Perisplenic short gastric collateral veins are seen which eventually drain into an epigastric collateral varix that drains into the superior mesenteric vein. 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 Adrenals/Urinary Tract: No adrenal masses identified. The kidneys appear unremarkable and show no evidence of hydronephrosis, masses or calculi. Ureters and bladder are unremarkable. Stomach/Bowel: No evidence of bowel obstruction. The stomach is mildly distended with fluid. Vascular/Lymphatic: There is soft tissue nodularity superior to the splenic artery and medial to the stomach. This may represent an enlarged gastrohepatic lymph node. Margination of this soft tissue is very vague due to lack of abdominal fat. The abdominal aorta and common iliac artery is demonstrate scattered calcified plaque without evidence of aneurysmal disease. The aorta is moderately tortuous. Reproductive: Prostate is unremarkable. Other: There may be a trace amount of ascites in the peritoneal cavity. It is difficult to delineate free-fluid due to lack of intra-abdominal fat. Musculoskeletal: Degenerative disc disease of the lumbar spine present  with a chronic appearing compression fracture of the L2 vertebral  body. Associated scoliosis. IMPRESSION: 1. Mass within the body of the pancreas measuring approximately 3.8 cm in greatest diameter. 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 The pancreatic mass causes distal atrophy and ductal dilatation of the tail of the pancreas. There may be metastatic spread to a gastrohepatic lymph node measuring approximately 2 cm. Margination of this potential metastatic lymph node is very difficult due to the lack of intra-abdominal fat and further soft tissue delineation may be possible with MRI of the abdomen. 2. Probable trace ascites in the peritoneal cavity. 3. Small adjacent calcifications along the subpleural/pleural left posterior hemithorax has a benign appearance and represents either 2 adjacent calcified granulomata or partially calcified pleural plaque. Electronically Signed   By: Aletta Edouard M.D.   On: 02/15/2017 15:49    Inessa Wardrop, [place], DO 02/17/2017, 9:17 AM PGY-1, [place] Intern pager: [phone], text pages welcome

## 2017-02-18 ENCOUNTER — Ambulatory Visit (HOSPITAL_COMMUNITY)
Admission: RE | Admit: 2017-02-18 | Discharge: 2017-02-18 | Disposition: A | Discharge status: Home / Self Care | Payer: BC Managed Care – PPO | Source: Ambulatory Visit | Attending: Gastroenterology | Admitting: Gastroenterology

## 2017-02-18 ENCOUNTER — Encounter (HOSPITAL_COMMUNITY)
Admission: RE | Disposition: A | Discharge status: Home / Self Care | Payer: Self-pay | Source: Ambulatory Visit | Attending: Gastroenterology

## 2017-02-18 ENCOUNTER — Other Ambulatory Visit: Payer: Self-pay

## 2017-02-18 ENCOUNTER — Ambulatory Visit (HOSPITAL_COMMUNITY): Payer: BC Managed Care – PPO | Admitting: Certified Registered Nurse Anesthetist

## 2017-02-18 ENCOUNTER — Telehealth: Payer: Self-pay

## 2017-02-18 ENCOUNTER — Encounter (HOSPITAL_COMMUNITY): Payer: Self-pay | Admitting: Emergency Medicine

## 2017-02-18 DIAGNOSIS — Z87891 Personal history of nicotine dependence: Secondary | ICD-10-CM | POA: Diagnosis not present

## 2017-02-18 DIAGNOSIS — K8689 Other specified diseases of pancreas: Secondary | ICD-10-CM | POA: Diagnosis not present

## 2017-02-18 DIAGNOSIS — C251 Malignant neoplasm of body of pancreas: Secondary | ICD-10-CM | POA: Diagnosis not present

## 2017-02-18 DIAGNOSIS — K869 Disease of pancreas, unspecified: Secondary | ICD-10-CM | POA: Diagnosis present

## 2017-02-18 DIAGNOSIS — C259 Malignant neoplasm of pancreas, unspecified: Secondary | ICD-10-CM

## 2017-02-18 HISTORY — PX: EUS: SHX5427

## 2017-02-18 HISTORY — PX: FINE NEEDLE ASPIRATION: SHX5430

## 2017-02-18 SURGERY — UPPER ENDOSCOPIC ULTRASOUND (EUS) LINEAR
Anesthesia: Monitor Anesthesia Care

## 2017-02-18 MED ORDER — PROPOFOL 10 MG/ML IV BOLUS
INTRAVENOUS | Status: AC
  Filled 2017-02-18: qty 20

## 2017-02-18 MED ORDER — EPHEDRINE SULFATE 50 MG/ML IJ SOLN
INTRAMUSCULAR | Status: DC | PRN
  Administered 2017-02-18: 10 mg via INTRAVENOUS

## 2017-02-18 MED ORDER — LACTATED RINGERS IV SOLN
INTRAVENOUS | Status: DC
  Administered 2017-02-18: 12:00:00 via INTRAVENOUS

## 2017-02-18 MED ORDER — PROPOFOL 10 MG/ML IV BOLUS
INTRAVENOUS | Status: AC
  Filled 2017-02-18: qty 60

## 2017-02-18 MED ORDER — PHENYLEPHRINE HCL 10 MG/ML IJ SOLN
INTRAMUSCULAR | Status: DC | PRN
  Administered 2017-02-18: 120 ug via INTRAVENOUS
  Administered 2017-02-18: 100 ug via INTRAVENOUS
  Administered 2017-02-18: 120 ug via INTRAVENOUS
  Administered 2017-02-18: 100 ug via INTRAVENOUS
  Administered 2017-02-18 (×2): 120 ug via INTRAVENOUS

## 2017-02-18 MED ORDER — PROPOFOL 500 MG/50ML IV EMUL
INTRAVENOUS | Status: DC | PRN
  Administered 2017-02-18: 100 ug/kg/min via INTRAVENOUS

## 2017-02-18 MED ORDER — PROPOFOL 500 MG/50ML IV EMUL
INTRAVENOUS | Status: DC | PRN
  Administered 2017-02-18: 30 mg via INTRAVENOUS

## 2017-02-18 NOTE — Discharge Instructions (Signed)
Monitored Anesthesia Care, Care After These instructions provide you with information about caring for yourself after your procedure. Your health care provider may also give you more specific instructions. Your treatment has been planned according to current medical practices, but problems sometimes occur. Call your health care provider if you have any problems or questions after your procedure. What can I expect after the procedure? After your procedure, it is common to:  Feel sleepy for several hours.  Feel clumsy and have poor balance for several hours.  Feel forgetful about what happened after the procedure.  Have poor judgment for several hours.  Feel nauseous or vomit.  Have a sore throat if you had a breathing tube during the procedure.  Follow these instructions at home: For at least 24 hours after the procedure:   Do not: ? Participate in activities in which you could fall or become injured. ? Drive. ? Use heavy machinery. ? Drink alcohol. ? Take sleeping pills or medicines that cause drowsiness. ? Make important decisions or sign legal documents. ? Take care of children on your own.  Rest. Eating and drinking  Follow the diet that is recommended by your health care provider.  If you vomit, drink water, juice, or soup when you can drink without vomiting.  Make sure you have little or no nausea before eating solid foods. General instructions  Have a responsible adult stay with you until you are awake and alert.  Take over-the-counter and prescription medicines only as told by your health care provider.  If you smoke, do not smoke without supervision.  Keep all follow-up visits as told by your health care provider. This is important. Contact a health care provider if:  You keep feeling nauseous or you keep vomiting.  You feel light-headed.  You develop a rash.  You have a fever. Get help right away if:  You have trouble breathing. This information is  not intended to replace advice given to you by your health care provider. Make sure you discuss any questions you have with your health care provider. Document Released: 06/16/2015 Document Revised: 10/16/2015 Document Reviewed: 06/16/2015 Elsevier Interactive Patient Education  2018 [place] HAD AN ENDOSCOPIC PROCEDURE TODAY: Refer to the procedure report that was given to you for any specific questions about what was found during the examination.  If the procedure report does not answer your questions, please call your gastroenterologist to clarify.  YOU SHOULD EXPECT: Some feelings of bloating in the abdomen. Passage of more gas than usual.  Walking can help get rid of the air that was put into your GI tract during the procedure and reduce the bloating. If you had a lower endoscopy (such as a colonoscopy or flexible sigmoidoscopy) you may notice spotting of blood in your stool or on the toilet paper.   DIET: Your first meal following the procedure should be a light meal and then it is ok to progress to your normal diet.  A half-sandwich or bowl of soup is an example of a good first meal.  Heavy or fried foods are harder to digest and may make you feel nasueas or bloated.  Drink plenty of fluids but you should avoid alcoholic beverages for 24 hours.  ACTIVITY: Your care partner should take you home directly after the procedure.  You should plan to take it easy, moving slowly for the rest of the day.  You can resume normal activity the day after the procedure however you should NOT DRIVE or use  heavy machinery for 24 hours (because of the sedation medicines used during the test).    SYMPTOMS TO REPORT IMMEDIATELY  A gastroenterologist can be reached at any hour.  Please call your doctor's office for any of the following symptoms:   Following lower endoscopy (colonoscopy, flexible sigmoidoscopy)  Excessive amounts of blood in the stool  Significant tenderness, worsening of abdominal  pains  Swelling of the abdomen that is new, acute  Fever of 100 or higher  Following upper endoscopy (EGD, EUS, ERCP)  Vomiting of blood or coffee ground material  New, significant abdominal pain  New, significant chest pain or pain under the shoulder blades  Painful or persistently difficult swallowing  New shortness of breath  Black, tarry-looking stools  FOLLOW UP: If any biopsies were taken you will be contacted by phone or by letter within the next 1-3 weeks.  Call your gastroenterologist if you have not heard about the biopsies in 3 weeks.  Please also call your gastroenterologist's office with any specific questions about appointments or follow up tests.

## 2017-02-18 NOTE — Transfer of Care (Signed)
Immediate Anesthesia Transfer of Care Note  Patient: Fred Bradford  Procedure(s) Performed: UPPER ENDOSCOPIC ULTRASOUND (EUS) LINEAR (N/A ) FINE NEEDLE ASPIRATION (FNA) LINEAR (N/A )  Patient Location: PACU  Anesthesia Type:MAC  Level of Consciousness: sedated  Airway & Oxygen Therapy: Patient Spontanous Breathing and Patient connected to nasal cannula oxygen  Post-op Assessment: Report given to RN and Post -op Vital signs reviewed and stable  Post vital signs: Reviewed and stable  Last Vitals:  Vitals:   02/18/17 1157  BP: 104/69  Pulse: 74  Resp: 19  Temp: 37.4 C  SpO2: 98%    Last Pain:  Vitals:   02/18/17 1157  TempSrc: Oral         Complications: No apparent anesthesia complications

## 2017-02-18 NOTE — Op Note (Signed)
[facility] Patient Name: Fred Bradford Procedure Date: 02/18/2017 MRN: [id number] Attending MD: [name] , MD Date of Birth: [birth year] CSN: [id number] Age: 66 Admit Type: Inpatient Procedure:                Upper EUS Indications:              mass in pancreas on CT and MRI; 10 pound weight                            loss over two months Providers:                [name], MD, Laverta Baltimore RN, RN, Cletis Athens, Technician Referring MD:             Jolly Mango, MD Medicines:                Monitored Anesthesia Care Complications:            No immediate complications. Estimated blood loss:                            None. Estimated Blood Loss:     Estimated blood loss: none. Procedure:                Pre-Anesthesia Assessment:                           - Prior to the procedure, a History and Physical                            was performed, and patient medications and                            allergies were reviewed. The patient's tolerance of                            previous anesthesia was also reviewed. The risks                            and benefits of the procedure and the sedation                            options and risks were discussed with the patient.                            All questions were answered, and informed consent                            was obtained. Prior Anticoagulants: The patient has                            taken no previous anticoagulant or antiplatelet  agents. ASA Grade Assessment: II - A patient with                            mild systemic disease. After reviewing the risks                            and benefits, the patient was deemed in                            satisfactory condition to undergo the procedure.                           After obtaining informed consent, the endoscope was                            passed under direct  vision. Throughout the                            procedure, the patient's blood pressure, pulse, and                            oxygen saturations were monitored continuously. The                            [id number] ([id number]) scope was introduced through                            the mouth, and advanced to the second part of                            duodenum. The upper EUS was accomplished without                            difficulty. The patient tolerated the procedure                            well. Findings:      Endoscopic Finding :      The examined esophagus was endoscopically normal.      The entire examined stomach was endoscopically normal.      The examined duodenum was endoscopically normal.      Endosonographic Finding :      1. An irregularly shaped mass was identified in the pancreatic body. The       mass was hypoechoic and heterogenous and it had poorly definted borders.       The mass measured 35 mm in maximal cross-sectional diameter. There was       sonographic evidence suggesting invasion into the portal vein       (manifested by interface loss greater for 66mm). The remainder of the       pancreas was examined. The pancreatic duct upstream from the mass was       dilated and ectatic (up to 62mm) Fine needle aspiration for cytology was       performed. Color Doppler imaging was utilized prior to needle puncture       to confirm  a lack of significant vascular structures within the needle       path. Four passes were made with the 25 gauge needle using a       transgastric approach. A cytotechnologist was present to evaluate the       adequacy of the specimen. Final cytology results are pending.      2. No peripancreatic adenopathy      3. CBD was normal, non-dilated      4. Gallbladder was normal. Impression:               - Irregularly shaped, 3.5cm mass in the pancreatic                            causing main pancreatic duct obstruction/dilation.                             Preliminary cytology review was positive for                            malignancy (adenocarcinoma). There was evidence of                            portal vein involvement (loss of normal tissue                            interface between the mass and the vein). SMA,                            celiac trunk, SMV appear clear of involvement. The                            splenic vessels are occluded and there is                            significant porto-systemic neovascularization in                            the abdomen as a result. Moderate Sedation:      N/A- Per Anesthesia Care Recommendation:           - Discharge patient to home (ambulatory).                           - Await cytology results.                           - My office will refer to medical oncology and                            surgical oncology for 'borderline' resectable                            pancreatic adenocarcinoma. Procedure Code(s):        --- Professional ---  43238, Esophagogastroduodenoscopy, flexible,                            transoral; with transendoscopic ultrasound-guided                            intramural or transmural fine needle                            aspiration/biopsy(s), (includes endoscopic                            ultrasound examination limited to the esophagus,                            stomach or duodenum, and adjacent structures) Diagnosis Code(s):        --- Professional ---                           K86.89, Other specified diseases of pancreas                           R93.3, Abnormal findings on diagnostic imaging of                            other parts of digestive tract CPT copyright 2016 American Medical Association. All rights reserved. The codes documented in this report are preliminary and upon coder review may  be revised to meet current compliance requirements. [name], MD 02/18/2017 1:22:14 PM This report  has been signed electronically. Number of Addenda: 0

## 2017-02-18 NOTE — Telephone Encounter (Signed)
Referrals made to Med [facility] and [facility].

## 2017-02-18 NOTE — Anesthesia Preprocedure Evaluation (Addendum)
Anesthesia Evaluation  Patient identified by MRN, date of birth, ID band Patient awake    Reviewed: Allergy & Precautions, NPO status , Patient's Chart, lab work & pertinent test results  History of Anesthesia Complications Negative for: history of anesthetic complications  Airway Mallampati: I  TM Distance: >3 FB Neck ROM: Full    Dental  (+) Poor Dentition, Loose, Missing, Dental Advisory Given   Pulmonary former smoker,    breath sounds clear to auscultation       Cardiovascular negative cardio ROS   Rhythm:Regular Rate:Normal     Neuro/Psych negative neurological ROS     GI/Hepatic Neg liver ROS, Abdominal pain, pancreatic mass   Endo/Other  negative endocrine ROS  Renal/GU negative Renal ROS     Musculoskeletal   Abdominal   Peds  Hematology negative hematology ROS (+)   Anesthesia Other Findings   Reproductive/Obstetrics                             Anesthesia Physical Anesthesia Plan  ASA: III  Anesthesia Plan: MAC   Post-op Pain Management:    Induction:   PONV Risk Score and Plan: 1 and Ondansetron and Treatment may vary due to age or medical condition  Airway Management Planned: Natural Airway and Nasal Cannula  Additional Equipment:   Intra-op Plan:   Post-operative Plan:   Informed Consent: I have reviewed the patients History and Physical, chart, labs and discussed the procedure including the risks, benefits and alternatives for the proposed anesthesia with the patient or authorized representative who has indicated his/her understanding and acceptance.   Dental advisory given  Plan Discussed with: CRNA and Surgeon  Anesthesia Plan Comments: (Plan routine monitors, MAC Dr. [name] aware of pt's poor dentition)        Anesthesia Quick Evaluation

## 2017-02-18 NOTE — Interval H&P Note (Signed)
History and Physical Interval Note:  02/18/2017 12:14 PM  Fred Bradford  has presented today for surgery, with the diagnosis of pancreatic mass  The various methods of treatment have been discussed with the patient and family. After consideration of risks, benefits and other options for treatment, the patient has consented to  Procedure(s) with comments: UPPER ENDOSCOPIC ULTRASOUND (EUS) LINEAR (N/A) FINE NEEDLE ASPIRATION (FNA) LINEAR (N/A) - pancreatic mass, needs biopsy.   as a surgical intervention .  The patient's history has been reviewed, patient examined, no change in status, stable for surgery.  I have reviewed the patient's chart and labs.  Questions were answered to the patient's satisfaction.     [name]

## 2017-02-18 NOTE — Telephone Encounter (Signed)
-----   Message from [name], MD sent at 02/18/2017  1:23 PM EST ----- Richardson Landry, just completed EUS:  Irregularly shaped, 3.5cm mass in the pancreatic causing main pancreatic duct obstruction/dilation.  Preliminary cytology review was positive for malignancy (adenocarcinoma). There was evidence of portal vein involvement (loss of normal tissue interface between the mass and the vein). SMA, celiac trunk, SMV appear clear of involvement. The splenic vessels are occluded and there is significant porto-systemic neovascularization in the abdomen as a result.  Elky Funches, He needs referral to medical oncology and Dr. Barry Dienes from [place]. For borderline resectable pancreatic adenocarcinoma.  Dawn, Can you put him on for next week cancer conference.  Aris Everts.    Thanks ALL

## 2017-02-18 NOTE — Anesthesia Postprocedure Evaluation (Signed)
Anesthesia Post Note  Patient: Community education officer  Procedure(s) Performed: UPPER ENDOSCOPIC ULTRASOUND (EUS) LINEAR (N/A ) FINE NEEDLE ASPIRATION (FNA) LINEAR (N/A )     Patient location during evaluation: Endoscopy Anesthesia Type: MAC Level of consciousness: awake and alert, oriented and patient cooperative Pain management: pain level controlled Vital Signs Assessment: post-procedure vital signs reviewed and stable Respiratory status: spontaneous breathing, nonlabored ventilation, respiratory function stable and patient connected to nasal cannula oxygen Cardiovascular status: blood pressure returned to baseline, stable, tachycardic and bradycardic Postop Assessment: no apparent nausea or vomiting Anesthetic complications: no    Last Vitals:  Vitals:   02/18/17 1330 02/18/17 1340  BP: 108/64 113/71  Pulse: 83 75  Resp: (!) 21 (!) 24  Temp:    SpO2: 99% 100%    Last Pain:  Vitals:   02/18/17 1328  TempSrc: Oral                 Taffany Heiser,E. Deniqua Perry

## 2017-02-19 ENCOUNTER — Telehealth: Payer: Self-pay

## 2017-02-19 ENCOUNTER — Ambulatory Visit: Payer: BC Managed Care – PPO | Admitting: Internal Medicine

## 2017-02-19 ENCOUNTER — Encounter (HOSPITAL_COMMUNITY): Payer: Self-pay | Admitting: Gastroenterology

## 2017-02-19 DIAGNOSIS — C251 Malignant neoplasm of body of pancreas: Secondary | ICD-10-CM

## 2017-02-19 MED ORDER — OXYCODONE-ACETAMINOPHEN 5-325 MG PO TABS: 1.0000 | ORAL_TABLET | Freq: Four times a day (QID) | ORAL | 0 refills | Status: AC | PRN

## 2017-02-19 NOTE — Assessment & Plan Note (Signed)
Biopsy consistent with pancreatic adenoma. Discussed this with patient, however recommended that specific questions be answered by patient's oncologist, Dr. Burr Medico, at their appointment in four days. Also encouraged patient to go to surgery appt in three days to discuss surgical options. Constipation well-controlled with current regimen. Sleep improved with pain control and trazodone. Pain well-controlled with Percocet. Refilled Percocet #30 with 0 refills today.

## 2017-02-19 NOTE — Telephone Encounter (Signed)
Placke, Dawn, RN  [name], MD; [name], MD; Havery Moros Carlota Raspberry, MD; Jeoffrey Massed, RN  Cc: Dewayne Hatch, April A        Mr. Perleberg is scheduled to see Dr. Burr Medico on 02/23/17. Is there a chance the CT could happen on Monday?   Dawn

## 2017-02-19 NOTE — Telephone Encounter (Signed)
You have been scheduled for a CT scan of the Chest at [facility] ([address]---this is in the same building as [facility]).   You are scheduled on 02/22/17 at 1045 am. You should arrive 15 minutes prior to your appointment time for registration. Please follow the written instructions below on the day of your exam:  WARNING: IF YOU ARE ALLERGIC TO IODINE/X-RAY DYE, PLEASE NOTIFY RADIOLOGY IMMEDIATELY AT [phone]! YOU WILL BE GIVEN A 13 HOUR PREMEDICATION PREP.  1) Do not eat or drink anything after 845 am (2 hours prior to your test)  You may take any medications as prescribed with a small amount of water except for the following: Metformin, Glucophage, Glucovance, Avandamet, Riomet, Fortamet, Actoplus Met, Janumet, Glumetza or Metaglip. The above medications must be held the day of the exam AND 48 hours after the exam.  The purpose of you drinking the oral contrast is to aid in the visualization of your intestinal tract. The contrast solution may cause some diarrhea. Before your exam is started, you will be given a small amount of fluid to drink. Depending on your individual set of symptoms, you may also receive an intravenous injection of x-ray contrast/dye. Plan on being at [facility] for 30 minutes or longer, depending on the type of exam you are having performed.  This test typically takes 30-45 minutes to complete.  If you have any questions regarding your exam or if you need to reschedule, you may call the CT department at [phone] between the hours of 8:00 am and 5:00 pm, Monday-Friday.  _____________________________________________

## 2017-02-19 NOTE — Progress Notes (Signed)
   Subjective:   Patient: Fred Bradford       Birthdate: [birth year]       MRN: [id number]      HPI  Fred Bradford is a 66 y.o. male presenting for hosp f/u.   Patient was admitted from 12/10-12/12. Initially presented with abdominal pain. Was found to have mass within body of pancreas invading splenic vein and artery with possible metastatic spread to gastrohepatic lymph node. MRI showed mass within the pancreatic body consistent with pancreatic carcinoma. Patient was seen by oncology who recommended biopsy of mass. Patient was subsequently transferred to [facility] hospital for biopsy to be performed by GI yesterday. He was discharged with Percocet for pain, Senna, Colace, and Miralax for constipation, and trazodone for insomnia.  Biopsy performed yesterday confirmed pancreatic adenocarcinoma described as borderline resectable. He was referred to oncology by GI and has appt scheduled for 12/18 with Dr. Truitt Merle. CT chest was also recommended and ordered.  Also has appt scheduled on Mon 12/17 with Dr. Janace Hoard at [facility] in [place]. Finally, also has appt scheduled with Geryl Rankins FNP on 12/20 for behavioral health support after his diagnosis.  Patient has no complaints today. Says his pain is well-controlled with current dose of Percocet. Is taking Colace, Senna, and Miralax as recommended. Last BM yesterday. Reports lots of belching. Says trazodone is helping him sleep which he is very pleased about.   Smoking status reviewed. Patient is former smoker.   Review of Systems See HPI.     Objective:  Physical Exam  Constitutional: He is oriented to person, place, and time.  Thin male in NAD  HENT:  Head: Normocephalic and atraumatic.  Eyes: Conjunctivae and EOM are normal. Right eye exhibits no discharge. Left eye exhibits no discharge.  Pulmonary/Chest: Effort normal. No respiratory distress.  Neurological: He is alert and oriented to person, place, and time.  Skin: Skin is  warm and dry.  Psychiatric: Affect and judgment normal.      Assessment & Plan:  Malignant neoplasm of body of pancreas ([place]) Biopsy consistent with pancreatic adenoma. Discussed this with patient, however recommended that specific questions be answered by patient's oncologist, Dr. Burr Medico, at their appointment in four days. Also encouraged patient to go to surgery appt in three days to discuss surgical options. Constipation well-controlled with current regimen. Sleep improved with pain control and trazodone. Pain well-controlled with Percocet. Refilled Percocet #30 with 0 refills today.    [name], MD, MPH PGY-3 [facility] Pager [phone]

## 2017-02-19 NOTE — Telephone Encounter (Signed)
-----   Message from [name], MD sent at 02/19/2017  7:19 AM EST ----- I'll order the CT chest.  Baden Betsch, Can you contact him and let him know we need him to have CT scan of chest (with IV contrast) for newly diagnosed pancreatic adenocarcinoma, staging.  Thanks  ----- Message ----- From: [name], MD Sent: 02/18/2017   3:33 PM To: [name], MD, April A Staton, #  Can we also get a CT chest? Will need to get this guy set up for neo.   tx FB  April, can I see this guy in [place] Monday? tx FB   ----- Message ----- From: [name], MD Sent: 02/18/2017   1:23 PM To: [name], MD, Jeoffrey Massed, RN, #  Richardson Landry, just completed EUS:  Irregularly shaped, 3.5cm mass in the pancreatic causing main pancreatic duct obstruction/dilation.  Preliminary cytology review was positive for malignancy (adenocarcinoma). There was evidence of portal vein involvement (loss of normal tissue interface between the mass and the vein). SMA, celiac trunk, SMV appear clear of involvement. The splenic vessels are occluded and there is significant porto-systemic neovascularization in the abdomen as a result.  Nik Gorrell, He needs referral to medical oncology and Dr. Barry Dienes from [facility]. For borderline resectable pancreatic adenocarcinoma.  Dawn, Can you put him on for next week cancer conference.  Aris Everts.    Thanks ALL

## 2017-02-19 NOTE — Telephone Encounter (Signed)
The pt has been scheduled and instructed for Monday.

## 2017-02-19 NOTE — Patient Instructions (Signed)
It was nice seeing you again today Fred Bradford.   The biopsy yesterday showed pancreatic adenocarcinoma, a type of pancreatic cancer. It is important to go to your appointment with Dr. Burr Medico, an oncologist, on Tuesday to learn more information about this and the treatment plan.   In the meantime, please continue taking the Percocet as needed for pain, the trazodone for sleep, and the other medications (Senna, Colace, and Miralax) for constipation.   If you have any questions or concerns, please feel free to call the clinic.   Be well,  Dr. Avon Gully

## 2017-02-22 ENCOUNTER — Ambulatory Visit (INDEPENDENT_AMBULATORY_CARE_PROVIDER_SITE_OTHER)
Admission: RE | Admit: 2017-02-22 | Discharge: 2017-02-22 | Disposition: A | Discharge status: Home / Self Care | Payer: BC Managed Care – PPO | Source: Ambulatory Visit | Attending: Gastroenterology | Admitting: Gastroenterology

## 2017-02-22 ENCOUNTER — Other Ambulatory Visit: Payer: Self-pay | Admitting: General Surgery

## 2017-02-22 DIAGNOSIS — C251 Malignant neoplasm of body of pancreas: Secondary | ICD-10-CM

## 2017-02-22 MED ORDER — IOPAMIDOL (ISOVUE-300) INJECTION 61%
80.0000 mL | Freq: Once | INTRAVENOUS | Status: AC | PRN
  Administered 2017-02-22: 80 mL via INTRAVENOUS

## 2017-02-23 ENCOUNTER — Encounter: Payer: Self-pay | Admitting: Nurse Practitioner

## 2017-02-23 ENCOUNTER — Ambulatory Visit (HOSPITAL_BASED_OUTPATIENT_CLINIC_OR_DEPARTMENT_OTHER): Payer: BC Managed Care – PPO | Admitting: Nurse Practitioner

## 2017-02-23 ENCOUNTER — Telehealth: Payer: Self-pay | Admitting: Nurse Practitioner

## 2017-02-23 VITALS — BP 113/67 | HR 100 | Temp 98.9°F | Resp 18 | Wt 111.6 lb

## 2017-02-23 DIAGNOSIS — R109 Unspecified abdominal pain: Secondary | ICD-10-CM | POA: Diagnosis not present

## 2017-02-23 DIAGNOSIS — G4700 Insomnia, unspecified: Secondary | ICD-10-CM

## 2017-02-23 DIAGNOSIS — I82890 Acute embolism and thrombosis of other specified veins: Secondary | ICD-10-CM | POA: Diagnosis not present

## 2017-02-23 DIAGNOSIS — R918 Other nonspecific abnormal finding of lung field: Secondary | ICD-10-CM | POA: Diagnosis not present

## 2017-02-23 DIAGNOSIS — Z8042 Family history of malignant neoplasm of prostate: Secondary | ICD-10-CM | POA: Diagnosis not present

## 2017-02-23 DIAGNOSIS — K5900 Constipation, unspecified: Secondary | ICD-10-CM | POA: Diagnosis not present

## 2017-02-23 DIAGNOSIS — C251 Malignant neoplasm of body of pancreas: Secondary | ICD-10-CM | POA: Diagnosis not present

## 2017-02-23 DIAGNOSIS — C259 Malignant neoplasm of pancreas, unspecified: Secondary | ICD-10-CM

## 2017-02-23 DIAGNOSIS — Z800 Family history of malignant neoplasm of digestive organs: Secondary | ICD-10-CM | POA: Diagnosis not present

## 2017-02-23 DIAGNOSIS — R634 Abnormal weight loss: Secondary | ICD-10-CM | POA: Diagnosis not present

## 2017-02-23 DIAGNOSIS — Z720 Tobacco use: Secondary | ICD-10-CM | POA: Diagnosis not present

## 2017-02-23 MED ORDER — ENOXAPARIN SODIUM 80 MG/0.8ML ~~LOC~~ SOLN
80.0000 mg | Freq: Once | SUBCUTANEOUS | Status: AC
  Administered 2017-02-23: 80 mg via SUBCUTANEOUS
  Filled 2017-02-23: qty 0.8

## 2017-02-23 MED ORDER — ENOXAPARIN SODIUM 80 MG/0.8ML ~~LOC~~ SOLN: 80.0000 mg | SUBCUTANEOUS | 1 refills | Status: DC

## 2017-02-23 MED ORDER — MIRTAZAPINE 7.5 MG PO TABS: 7.5000 mg | ORAL_TABLET | Freq: Every day | ORAL | 1 refills | Status: DC

## 2017-02-23 MED FILL — MIRTAZAPINE 7.5 MG TABLET: 7.5 | 30 days supply | Qty: 30 | Fill #0

## 2017-02-23 MED FILL — ENOXAPARIN 80 MG/0.8 ML SYR: 80 | 30 days supply | Qty: 24 | Fill #0

## 2017-02-23 NOTE — Telephone Encounter (Signed)
Scheduled appt per 12/18 los - Gave patient AVS and calender per los.  

## 2017-02-23 NOTE — Progress Notes (Addendum)
[place]  [phone] [phone]  New consult note   Patient Care Team: Verner Mould, MD as PCP - General (Family Medicine) Truitt Merle, MD as Consulting Physician (Hematology) [name], MD as Consulting Physician (General Surgery) Alla Feeling, NP as Nurse Practitioner (Nurse Practitioner) 02/24/2017  REFERRAL PHYSICIAN: Dr. Enid Derry   CHIEF COMPLAINT: Newly diagnosed pancreas cancer  SUMMARY OF ONCOLOGIC HISTORY:   Malignant neoplasm of body of pancreas [facility])    Initial Diagnosis    Malignant neoplasm of body of pancreas ([place])      02/15/2017 Imaging    CT AP w contrast IMPRESSION: 1. Mass within the body of the pancreas measuring approximately 3.8 cm in greatest diameter. 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 The pancreatic mass causes distal atrophy and ductal dilatation of the tail of the pancreas. There may be metastatic spread to a gastrohepatic lymph node measuring approximately 2 cm. Margination of this potential metastatic lymph node is very difficult due to the lack of intra-abdominal fat and further soft tissue delineation may be possible with MRI of the abdomen. 2. Probable trace ascites in the peritoneal cavity. 3. Small adjacent calcifications along the subpleural/pleural left posterior hemithorax has a benign appearance and represents either 2 adjacent calcified granulomata or partially calcified pleural plaque.       02/15/2017 Imaging    MR abdomen w/wo contrast  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  Bulky peripancreatic  adenopathy with extension into the porta hepatis. This causes obstruction of left intrahepatic bile ducts.      02/18/2017 Initial Biopsy    Diagnosis FINE NEEDLE ASPIRATION, ENDOSCOPIC, PANCREAS, BODY (SPECIMEN 1 OF 1 COLLECTED 02/18/17): MALIGNANT CELLS CONSISTENT WITH ADENOCARCINOMA.      02/22/2017 Imaging    CT chest w contrast IMPRESSION: 1. Bilateral subsolid and partially solid nodules in addition to a solid 1.3 by 1.1 cm right upper lobe nodule, appearance suspicious for malignancy. Given the spiculated and subsolid appearance of some of these nodules, I cannot exclude a second primary lung malignancy in addition to the known pancreatic lesion. 2. Known pancreatic mass with stable findings in the upper abdomen. 3. 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 4.  Aortic Atherosclerosis (ICD10-I70.0).        HISTORY OF PRESENTING ILLNESS: Fred Bradford began feeling "uneasy" in his middle abdomen in early November that developed into persistent pain. He was lifting a heavy box at work 02/12/17 and experienced acutely increased pain. He presented to urgent care few days after and was referred to oncologist. However, pain escalated and he went to ED 02/15/17. From onset of symptoms he has lost approx 40 pounds due to decreased appetite. He has no significant past medical or surgical history. He has family history positive for pancreas cancer (mother), and prostate cancer (father). He has 2 sons who are alive and healthy. He smoked cigarettes for 50 years, quit in 2017 but has recently smoked few cigarettes due to stress over his diagnosis. He lives with significant other. He works at [employer] and is independent with ADLs and activities. He drives himself.   Fred Bradford presents for first f/u since hospital  discharge on 02/17/17. Has been resting a lot lately, not currently working. Reports non-radiating pain in middle abdomen 4/10. He takes 2 percocet q6  hours PRN, improves pain to 1/10. Mild constipation, senna does not work. He maintains regular BM with mag citrate; denies nausea, vomiting, diarrhea. Has been taking trazadone for insomnia since hospital d/c which has been helping but last night could not sleep. His mood is positive.   REVIEW OF SYSTEMS:   Constitutional: Denies fevers, chills (+) abnormal weight loss (+) decreased appetite  Eyes: Denies blurriness of vision Ears, nose, mouth, throat, and face: Denies mucositis or sore throat Respiratory: Denies cough, dyspnea or wheezes Cardiovascular: Denies palpitation, chest discomfort or lower extremity swelling Gastrointestinal:  Denies nausea, vomiting, constipation, diarrhea, heartburn or change in bowel habits (+) mild constipation, last BM 1 day ago  Skin: Denies abnormal skin rashes Lymphatics: Denies new lymphadenopathy or easy bruising Neurological:Denies numbness, tingling or new weaknesses Behavioral/Psych: Mood is stable, no new changes  All other systems were reviewed with the patient and are negative.  MEDICAL HISTORY:  Past Medical History:  Diagnosis Date  . Pancreatic cancer [facility])     SURGICAL HISTORY: Past Surgical History:  Procedure Laterality Date  . EUS N/A 02/18/2017   Procedure: UPPER ENDOSCOPIC ULTRASOUND (EUS) LINEAR;  Surgeon: [name], MD;  Location: [facility] END[facility];  Service: Endoscopy;  Laterality: N/A;  . FINE NEEDLE ASPIRATION N/A 02/18/2017   Procedure: FINE NEEDLE ASPIRATION (FNA) LINEAR;  Surgeon: [name], MD;  Location: [facility]OSCOPY;  Service: Endoscopy;  Laterality: N/A;  pancreatic mass, needs biopsy.      I have reviewed the social history and family history with the patient and they are unchanged from previous note.  ALLERGIES:  has No Known Allergies.  MEDICATIONS:  Current Outpatient Medications  Medication Sig Dispense Refill  . magnesium citrate SOLN Take 1 Bottle by mouth once as needed for moderate constipation.      Marland Kitchen oxyCODONE-acetaminophen (PERCOCET/ROXICET) 5-325 MG tablet Take 1-2 tablets by mouth every 6 (six) hours as needed for severe pain. 30 tablet 0  . polyethylene glycol (MIRALAX / GLYCOLAX) packet Take 17 g by mouth daily as needed for mild constipation. 14 each 0  . senna (SENOKOT) 8.6 MG TABS tablet Take 1 tablet (8.6 mg total) by mouth 2 (two) times daily. 30 each 0  . traZODone (DESYREL) 50 MG tablet Take 1 tablet (50 mg total) by mouth at bedtime as needed for sleep. 30 tablet 0  . enoxaparin (LOVENOX) 80 MG/0.8ML injection Inject 0.8 mLs (80 mg total) into the skin daily. 30 Syringe 1  . mirtazapine (REMERON) 7.5 MG tablet Take 1 tablet (7.5 mg total) by mouth at bedtime. 30 tablet 1   No current facility-administered medications for this visit.     PHYSICAL EXAMINATION: ECOG PERFORMANCE STATUS: 2 - Symptomatic, <50% confined to bed  Vitals:   02/23/17 1336 02/23/17 1340  BP: 113/67 113/67  Pulse: 100 100  Resp: 18 18  Temp: 98.9 F (37.2 C) 98.9 F (37.2 C)  SpO2: 100% 100%   Filed Weights   02/23/17 1336 02/23/17 1340  Weight: 111 lb 9.6 oz (50.6 kg) 111 lb 9.6 oz (50.6 kg)    GENERAL:alert, no distress and comfortable (+) cachectic appearance SKIN: skin color, texture, turgor are normal, no rashes or significant lesions EYES: normal, Conjunctiva are pink and non-injected, sclera clear OROPHARYNX:no exudate, no erythema and lips, buccal mucosa, and tongue normal  NECK: supple, thyroid normal size,  non-tender, without nodularity LYMPH:  no palpable cervical, supraclavicular, axillary, or inguinal lymphadenopathy  LUNGS: clear to auscultation bilaterally with normal breathing effort HEART: regular rate & rhythm and no murmurs and no lower extremity edema ABDOMEN:abdomen soft, non-tender and normal bowel sounds Musculoskeletal:no cyanosis of digits and no clubbing  NEURO: alert & oriented x 3 with fluent speech, no focal motor/sensory deficits  LABORATORY DATA:  I have  reviewed the data as listed CBC Latest Ref Rng & Units 02/16/2017 02/15/2017  WBC 4.0 - 10.5 K/uL 7.6 8.3  Hemoglobin 13.0 - 17.0 g/dL 12.9(L) 14.0  Hematocrit 39.0 - 52.0 % 39.3 42.1  Platelets 150 - 400 K/uL 315 350    CMP Latest Ref Rng & Units 02/16/2017 02/15/2017  Glucose 65 - 99 mg/dL 109(H) 116(H)  BUN 6 - 20 mg/dL 12 17  Creatinine 0.61 - 1.24 mg/dL 0.69 0.94  Sodium 135 - 145 mmol/L 133(L) 136  Potassium 3.5 - 5.1 mmol/L 3.8 3.6  Chloride 101 - 111 mmol/L 102 102  CO2 22 - 32 mmol/L 25 27  Calcium 8.9 - 10.3 mg/dL 8.8(L) 9.3  Total Protein 6.5 - 8.1 g/dL 6.3(L) 7.6  Total Bilirubin 0.3 - 1.2 mg/dL 0.7 0.7  Alkaline Phos 38 - 126 U/L 116 125  AST 15 - 41 U/L 30 31  ALT 17 - 63 U/L 34 39    RADIOGRAPHIC STUDIES: I have personally reviewed the radiological images as listed and agreed with the findings in the report. Ct Chest W Contrast  Result Date: 02/22/2017 CLINICAL DATA:  Newly diagnosed pancreatic cancer, staging assessment. Weight loss. EXAM: CT CHEST WITH CONTRAST TECHNIQUE: Multidetector CT imaging of the chest was performed during intravenous contrast administration. CONTRAST:  47mL ISOVUE-300 IOPAMIDOL (ISOVUE-300) INJECTION 61% COMPARISON:  CT and MRI examinations from 02/15/2017 FINDINGS: Cardiovascular: Atherosclerotic calcification of the aortic arch. Mediastinum/Nodes: No pathologic mediastinal adenopathy. Lungs/Pleura: Solid 1.3 by 1.1 cm right upper lobe nodule with mildly spiculated borders, image 82/3. Part solid right middle lobe pulmonary nodule measuring 1.0 by 0.9 cm on image 106/3. Sub solid right upper lobe pulmonary nodule 1.3 by 1.9 cm on image 60/3. Sub solid left upper lobe pulmonary nodule measuring 3.0 by 1.8 cm on image 32/3. This has a confluent more solid portion inferiorly measuring 1.0 by 0.7 cm, image 36/3. 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 Some of these are calcified. No pleural effusion.  Upper Abdomen: Pancreatic mass with dilated dorsal pancreatic duct in the pancreatic tail, as noted on recent prior dedicated abdominal imaging exams. Gastric varices reconstitute the splenic vein. Left hepatic duct obstruction due to adenopathy in the porta hepatis. Indistinct fat planes in the porta hepatis along with periportal edema. Indistinct potential adenopathy in the right gastric chain. Musculoskeletal: No findings of osseous metastatic disease. We partially image the known deformity at the L2 vertebral level with bridging spurring at L1-2. IMPRESSION: 1. Bilateral subsolid and partially solid nodules in addition to a solid 1.3 by 1.1 cm right upper lobe nodule, appearance suspicious for malignancy. Given the spiculated and subsolid appearance of some of these nodules, I cannot exclude a second primary lung malignancy in addition to the known pancreatic lesion. 2. Known pancreatic mass with stable findings in the upper abdomen. 3. 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 4.  Aortic Atherosclerosis (ICD10-I70.0). Electronically Signed   By: Van Clines M.D.   On: 02/22/2017 12:31     ASSESSMENT & PLAN: 66 y.o. male with no past  medical history presented with vague abdominal pain now with adenocarcinoma of the pancreas and multiple lung nodules found on staging work up.  1. Adenocarcinoma of body of pancreas -We reviewed imaging and pathology results in detail. The 4.3 cm mass in the body of the pancreas is adenocarcinoma, we discussed the usually aggressive nature of his disease.  -CT indicates bulky peripancreatic adenopathy with extension into the porta hepatis which is causing obstruction of left intrahepatic bile ducts. He has at least locally advanced disease, but likely metastatic, as there are multiple lung nodules found on CT chest suspicious for metastatic pancreatic cancer vs other primary lung cancer.  -Dr. Burr Medico recommends biopsy of lung nodule to  confirm, either via bronchoscopy or IR guided.  -Will obtain PET scan prior to biopsy to help guide decision making and determine which lung nodule best to biopsy. The patient agrees.  -He was seen by Dr. Barry Dienes on 12/17 who considers him to be borderline resectable; if lung biopsy confirms metastatic pancreas adenocarcinoma, he would not be offered surgery and offered systemic chemotherapy instead; the goal would be palliative.  -If he has second primary, his treatment plan would be different and he may still be surgical candidate for pancreas mass.  -He will attend chemo education class.  -Will ask Dr. Barry Dienes to place at [facility], or could be done with IR biopsy.  -He will return in 2 weeks to discuss results and finalize treatment plan; chemo to begin potentially in 3 weeks   2. Pulmonary nodules -Patient smoked 1 PPD for 50 years, he is at high risk for lung cancer.  -CT chest indicates bilateral subsolid and partially solid nodules in addition to a solid 1.3 by 1.1 cm right upper lobe nodule, appearance suspicious for malignancy and a second lung primary could not be ruled out.  -He will undergo biopsy after PET scan via bronch vs IR.   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  -His pancreatic cancer places him at higher risk for thromboembolism.  -Will anticoagulate with lovenox 80 mg/day. He will receive first injection here with teaching per RN. -We reviewed s/sx to report such as any bleeding   4. Weight loss -He has cachetic appearance with 40 pounds weight loss in 1.5 months due to decreased appetite. -His partner has been making him protein smoothies, I encouraged him to continue this and to add 2-3 boost or ensure per day to add calories to his diet. He needs to gain weight in order to tolerate chemotherapy. -I prescribed Remeron to improve appetite, this will also help his insomnia, previously treated with  trazadone; his mood is good now.  PLAN: -Discuss case in GI tumor conference 12/19 -PET scan in 1 week -Bronch vs IR Lung biopsy after PET -PAC placement -lab, chemo class, f/u [place] 1/4 -lab, flush, chemo week of 1/7 -Begin Remeron for weight loss, insomnia; Lovenox for splenic vein thrombosis - Prescriptions send today -Urgent referral to dietician for weight loss; begin 2-3 ensure per day   Orders Placed This Encounter  Procedures  . NM PET Image Initial (PI) Skull Base To Thigh    Standing Status:   Future    Standing Expiration Date:   02/23/2018    Order Specific Question:   If indicated for the ordered procedure, I authorize the administration of a radiopharmaceutical per Radiology protocol    Answer:   Yes    Order Specific Question:   Preferred  imaging location?    Answer:   [facility]    Order Specific Question:   Radiology Contrast Protocol - do NOT remove file path    Answer:   [email or web address]    Order Specific Question:   Reason for Exam additional comments    Answer:   pancreatic cancer with pulmonary nodules on CT evaluate metastatic cancer vs primary lung malignancy  . Ambulatory referral to Nutrition and Diabetic E    Referral Priority:   Urgent    Referral Type:   Consultation    Referral Reason:   Specialty Services Required    Number of Visits Requested:   1   All questions were answered. The patient knows to call the clinic with any problems, questions or concerns. No barriers to learning was detected. I spent 45 minutes counseling the patient face to face. The total time spent in the appointment was 60 minutes and more than 50% was on counseling, review of test results, and coordination of care.      Alla Feeling, NP 02/24/17   Addendum I have seen the patient, examined him. I agree with the assessment and and plan and have edited the notes.   Fred Bradford is a 66 year old African-American male, without  significant past medical history, heavy smoker, presented with vague abdominal pain, weight loss and anorexia.  CT scan and EUS biopsy confirmed pancreatic adenocarcinoma.  I reviewed his CT scan images with patient and his son in detail, his multiple home lesions appears to be different (groundglass versus solid mass), also metastatic cancer from pancreas is likely, primary lung cancer is not ruled out.  I recommend PET scan for further evaluation, and will present his case in tumor board to determine bronchoscopy versus IR percutaneous biopsy of his lung nodule tissue diagnosis.  We discussed the treatment option for metastatic pancreas cancer, which is mainly chemotherapy. I would consider FOLFIRINOX or gemcitabie+abraxane as first line therapy.  If he does have secondary primary malignancy lung cancer, the treatment will be determined based on the histology and staging.  I plan to see him back in 2 weeks after his lung biopsy to finalize his treatment plan.  Meantime we will arrange nutrition consult, chemo class, port placement, get him ready for chemotherapy. I also recommend lovenox for his splenic vein thrombosis, given the high risk of thrombosis from pancreatic cancer.  The above was discussed with patient in details, he agrees to proceed.  Truitt Merle  02/23/2017

## 2017-02-24 ENCOUNTER — Telehealth: Payer: Self-pay

## 2017-02-24 ENCOUNTER — Encounter: Payer: Self-pay | Admitting: Nurse Practitioner

## 2017-02-24 NOTE — Telephone Encounter (Signed)
Nutrition  Called patient to schedule nutrition appointment.  Patient's fiance Ramona answered as patient is currently sleeping.  Offered nutrition appointment for tomorrow 12/20 at 11:15am with Ernestene Kiel.  Ramona agreed to appointment and told Ramona where nutrition office is located within [facility].  She verbalized understanding.  Sheryl Towell B. Zenia Resides, [place], [place] Registered Dietitian [phone] (pager)

## 2017-02-25 ENCOUNTER — Other Ambulatory Visit: Payer: Self-pay | Admitting: Nurse Practitioner

## 2017-02-25 ENCOUNTER — Ambulatory Visit: Payer: BC Managed Care – PPO | Admitting: Nutrition

## 2017-02-25 ENCOUNTER — Other Ambulatory Visit: Payer: Self-pay | Admitting: General Surgery

## 2017-02-25 DIAGNOSIS — R918 Other nonspecific abnormal finding of lung field: Secondary | ICD-10-CM

## 2017-02-25 DIAGNOSIS — C251 Malignant neoplasm of body of pancreas: Secondary | ICD-10-CM

## 2017-02-25 NOTE — Progress Notes (Signed)
  Oncology Nurse Navigator Documentation  Navigator Location: [facility] (02/25/17 1001) Referral date to RadOnc/MedOnc: 02/25/17 (02/25/17 1001) )Navigator Encounter Type: Introductory phone call (02/25/17 1001)   Abnormal Finding Date: 02/15/17 (02/25/17 1001) Confirmed Diagnosis Date: 02/22/17 (02/25/17 1001)                 Treatment Phase: Pre-Tx/Tx Discussion (02/25/17 1001) Barriers/Navigation Needs: Coordination of Care (02/25/17 1001)   Interventions: Coordination of Care;Psycho-social support (02/25/17 1001)   Coordination of Care: Appts;Other(PET Scan appointment) (02/25/17 1001)   Called apetient to introduce myself and to let patient know that he has been scheduled for a PET on 03/01/17 @ 7:30 Am at [facility]. I left VM explaining that patient will need to be NPO after MN and will need to arrrive by 7 AM on 03/01/17 to register for his PET at 7:30 AM. I requested that patient call back to confirm details.     Acuity: Level 2 (02/25/17 1001)   Acuity Level 2: Educational needs;Assistance expediting appointments;Ongoing guidance and education throughout treatment as needed (02/25/17 1001)     Time Spent with Patient: 30 (02/25/17 1001)

## 2017-02-25 NOTE — Progress Notes (Signed)
66 year old male diagnosed with metastatic pancreas cancer.  He is a patient of Dr. Burr Medico.  Past medical history includes tobacco.  Medications include Remeron.  Labs include sodium 133, glucose 109, albumin 3.2.  Height: 5 feet 9 inches. Weight: 111.6 pounds Usual body weight: 145 pounds. BMI: 16.48.  Patient endorses 40 pound weight loss due to decreased appetite and flank pain. Patient reports he is struggling with constipation.  He states M.D. told him to drink one bottle magnesium citrate every other day. Reports increased gas. Patient consumes very small amounts of food. Reports decreased tolerance of oral nutrition supplements. Unsure of time of weight loss.  Chart reflects patient weighed 145 pounds, December 13 and 111.6 pounds, December 18. Patient has severe temporal wasting but did not complete a full physical exam.  Nutrition diagnosis: Unintended weight loss secondary to metastatic pancreas cancer as evidenced by 33% weight loss from usual body weight.  Intervention: Educated patient to consume small frequent meals and snacks consisting of high-calorie, high-protein foods. Recommended patient consume 4 ounces of oral nutrition supplements every 3-4 hours until tolerance established. Recommended bowel regimen. Educated patient on strategies for reducing increased gas. Educated patient on strategies for improving constipation. Provided fact sheets, samples, coupons and contact information.  Teach back method used.  Questions were answered.  Monitoring, evaluation, goals: Patient will tolerate increased calories and protein to promote weight gain.  Next visit: Wednesday, January 9 after M.D.  **Disclaimer: This note was dictated with voice recognition software. Similar sounding words can inadvertently be transcribed and this note may contain transcription errors which may not have been corrected upon publication of note.**

## 2017-02-26 ENCOUNTER — Encounter (HOSPITAL_COMMUNITY): Payer: Self-pay

## 2017-02-26 ENCOUNTER — Encounter: Payer: Self-pay | Admitting: Nurse Practitioner

## 2017-02-26 ENCOUNTER — Emergency Department (HOSPITAL_COMMUNITY)
Admission: EM | Admit: 2017-02-26 | Discharge: 2017-02-26 | Disposition: A | Discharge status: Home / Self Care | Payer: BC Managed Care – PPO | Attending: Emergency Medicine | Admitting: Emergency Medicine

## 2017-02-26 ENCOUNTER — Ambulatory Visit: Payer: BC Managed Care – PPO | Admitting: Nurse Practitioner

## 2017-02-26 ENCOUNTER — Other Ambulatory Visit: Payer: Self-pay

## 2017-02-26 VITALS — BP 80/60 | HR 100 | Temp 98.6°F | Ht 69.0 in | Wt 110.8 lb

## 2017-02-26 DIAGNOSIS — E46 Unspecified protein-calorie malnutrition: Secondary | ICD-10-CM | POA: Insufficient documentation

## 2017-02-26 DIAGNOSIS — E860 Dehydration: Secondary | ICD-10-CM | POA: Insufficient documentation

## 2017-02-26 DIAGNOSIS — Z87891 Personal history of nicotine dependence: Secondary | ICD-10-CM | POA: Insufficient documentation

## 2017-02-26 DIAGNOSIS — I9589 Other hypotension: Secondary | ICD-10-CM | POA: Diagnosis not present

## 2017-02-26 DIAGNOSIS — R000 Tachycardia, unspecified: Secondary | ICD-10-CM | POA: Insufficient documentation

## 2017-02-26 DIAGNOSIS — G8929 Other chronic pain: Secondary | ICD-10-CM | POA: Insufficient documentation

## 2017-02-26 DIAGNOSIS — R634 Abnormal weight loss: Secondary | ICD-10-CM | POA: Insufficient documentation

## 2017-02-26 DIAGNOSIS — R1111 Vomiting without nausea: Secondary | ICD-10-CM | POA: Insufficient documentation

## 2017-02-26 DIAGNOSIS — Z8042 Family history of malignant neoplasm of prostate: Secondary | ICD-10-CM | POA: Insufficient documentation

## 2017-02-26 DIAGNOSIS — I959 Hypotension, unspecified: Secondary | ICD-10-CM

## 2017-02-26 DIAGNOSIS — K5900 Constipation, unspecified: Secondary | ICD-10-CM | POA: Insufficient documentation

## 2017-02-26 DIAGNOSIS — Z9889 Other specified postprocedural states: Secondary | ICD-10-CM | POA: Insufficient documentation

## 2017-02-26 DIAGNOSIS — G4700 Insomnia, unspecified: Secondary | ICD-10-CM

## 2017-02-26 DIAGNOSIS — R1084 Generalized abdominal pain: Secondary | ICD-10-CM | POA: Insufficient documentation

## 2017-02-26 DIAGNOSIS — Z79899 Other long term (current) drug therapy: Secondary | ICD-10-CM | POA: Insufficient documentation

## 2017-02-26 DIAGNOSIS — Z79891 Long term (current) use of opiate analgesic: Secondary | ICD-10-CM

## 2017-02-26 DIAGNOSIS — C259 Malignant neoplasm of pancreas, unspecified: Secondary | ICD-10-CM | POA: Insufficient documentation

## 2017-02-26 DIAGNOSIS — Z800 Family history of malignant neoplasm of digestive organs: Secondary | ICD-10-CM

## 2017-02-26 LAB — BASIC METABOLIC PANEL
ANION GAP: 9 (ref 5–15)
BUN: 23 mg/dL — ABNORMAL HIGH (ref 6–20)
CO2: 31 mmol/L (ref 22–32)
Calcium: 9.8 mg/dL (ref 8.9–10.3)
Chloride: 95 mmol/L — ABNORMAL LOW (ref 101–111)
Creatinine, Ser: 0.84 mg/dL (ref 0.61–1.24)
Glucose, Bld: 114 mg/dL — ABNORMAL HIGH (ref 65–99)
POTASSIUM: 4.2 mmol/L (ref 3.5–5.1)
SODIUM: 135 mmol/L (ref 135–145)

## 2017-02-26 LAB — URINALYSIS, ROUTINE W REFLEX MICROSCOPIC
Bilirubin Urine: NEGATIVE
Glucose, UA: NEGATIVE mg/dL
Hgb urine dipstick: NEGATIVE
KETONES UR: NEGATIVE mg/dL
LEUKOCYTES UA: NEGATIVE
NITRITE: NEGATIVE
PH: 6 (ref 5.0–8.0)
Protein, ur: NEGATIVE mg/dL
SPECIFIC GRAVITY, URINE: 1.023 (ref 1.005–1.030)

## 2017-02-26 LAB — HEPATIC FUNCTION PANEL
ALT: 53 U/L (ref 17–63)
AST: 48 U/L — ABNORMAL HIGH (ref 15–41)
Albumin: 4.1 g/dL (ref 3.5–5.0)
Alkaline Phosphatase: 179 U/L — ABNORMAL HIGH (ref 38–126)
BILIRUBIN INDIRECT: 0.6 mg/dL (ref 0.3–0.9)
Bilirubin, Direct: 0.1 mg/dL (ref 0.1–0.5)
TOTAL PROTEIN: 7.9 g/dL (ref 6.5–8.1)
Total Bilirubin: 0.7 mg/dL (ref 0.3–1.2)

## 2017-02-26 LAB — LIPASE, BLOOD: Lipase: 26 U/L (ref 11–51)

## 2017-02-26 LAB — CBC
HEMATOCRIT: 43.2 % (ref 39.0–52.0)
HEMOGLOBIN: 14.3 g/dL (ref 13.0–17.0)
MCH: 28.6 pg (ref 26.0–34.0)
MCHC: 33.1 g/dL (ref 30.0–36.0)
MCV: 86.4 fL (ref 78.0–100.0)
Platelets: 374 10*3/uL (ref 150–400)
RBC: 5 MIL/uL (ref 4.22–5.81)
RDW: 13.9 % (ref 11.5–15.5)
WBC: 8.4 10*3/uL (ref 4.0–10.5)

## 2017-02-26 LAB — CBG MONITORING, ED: Glucose-Capillary: 125 mg/dL — ABNORMAL HIGH (ref 65–99)

## 2017-02-26 LAB — I-STAT CG4 LACTIC ACID, ED: Lactic Acid, Venous: 0.83 mmol/L (ref 0.5–1.9)

## 2017-02-26 MED ORDER — SODIUM CHLORIDE 0.9 % IV BOLUS (SEPSIS)
1000.0000 mL | Freq: Once | INTRAVENOUS | Status: AC
  Administered 2017-02-26: 1000 mL via INTRAVENOUS

## 2017-02-26 MED ORDER — TRAZODONE HCL 100 MG PO TABS: 100.0000 mg | ORAL_TABLET | Freq: Every day | ORAL | 0 refills | Status: DC

## 2017-02-26 NOTE — ED Triage Notes (Signed)
Pt here from PCP for complaint of hypotension and tachycardia. Pt was told by his PCP to come here. Pt was recently diagnosed with pancreatic cancer. Pt denies chest pain or shortness of breath.

## 2017-02-26 NOTE — Progress Notes (Addendum)
Assessment & Plan:  [name] was seen today for new patient (initial visit).  Diagnoses and all orders for this visit:  Generalized abdominal pain Likely due to pancreatic cancer or constipation. Needs good bowel regimen. Recommended increasing prune juice at home and adding miralax to juice.    Hypotension, unspecified hypotension type with Tachycardia Sent to ED  Insomnia -     traZODone (DESYREL) 100 MG tablet; Take 1 tablet (100 mg total) by mouth at bedtime.    Patient has been counseled on age-appropriate routine health concerns for screening and prevention. These are reviewed and up-to-date. Referrals have been placed accordingly. Immunizations are up-to-date or declined.    Subjective:   Chief Complaint  Patient presents with  . New Patient (Initial Visit)    Patient stated he is having pain on his right abdominal side due to constipation.    HPI Fred Bradford 66 y.o. male presents to office today to establish care as a new patient. He is accompanied by his "wife" (fiance) today. He has no significant past medical history. He is a former smoker. Per report; smoked for at least 50 years.  [name] WAS SENT TO THE ED DUE TO HYPOTENSION, TACHYCARDIA AND LIKELY DEHYDRATION.   Pancreatic Cancer He was recently diagnosed with pancreatic cancer in December 2018 after being evaluated in the ED for abdominal pain, 40lb weight loss and constipation on 02-15-2017. Biopsy confirmed pancreatic adenocarcinoma. He saw oncology for the first time on 02-23-2017. There is a plan for biopsy of lung due to nodules seen on imaging which could possibly be related to metastatic disease. He will also need PET scan 03-01-2017. Lovenox has been started due to high risk of thromboembolism.   Abdominal Pain He is currently endorsing abdominal pain and increased belching with constipation still not adequately relieved unless he takes magnesium citrate. He reports no relief with senna or miralax.  Wife reports his intake of food has increased with remeron (prescribed by oncology) however his intake of liquids is still poor. He vomited last night after eating some cheese but reports no vomiting episodes today. He had grits, eggs and 1/3 of a blueberry muffin this morning. He had " a 1/2 cup ginger ale today, a swallow of prune juice, a half bottle of water and 1/2 bottle of ensure".  He endorses right sided abdominal pain which is likely related to his recent diagnosis.    Insomnia He is taking 50mg  of trazodone for sleep and also remeron was recently just added a few days ago but he does not feel it is working well for him. Still only resting at night with no "real sleeping".   Hypotension with Tachycardia Asymptomatic. I inquired into BLE weakness and patient states he does not have weakness but his legs hurt.   Review of Systems  Constitutional: Negative for fever, malaise/fatigue and weight loss.  HENT: Negative.  Negative for nosebleeds.   Eyes: Negative.  Negative for blurred vision, double vision and photophobia.  Respiratory: Negative.  Negative for cough and shortness of breath.   Cardiovascular: Negative.  Negative for chest pain, palpitations and leg swelling.  Gastrointestinal: Positive for abdominal pain and constipation. Negative for diarrhea, heartburn, nausea and vomiting.  Musculoskeletal: Negative.  Negative for myalgias.       Leg pain (bilateral)  Neurological: Negative.  Negative for dizziness, focal weakness, seizures and headaches.  Endo/Heme/Allergies: Negative for environmental allergies.  Psychiatric/Behavioral: Negative.  Negative for suicidal ideas.    Past Medical History:  Diagnosis Date  . Pancreatic cancer [facility])     Past Surgical History:  Procedure Laterality Date  . EUS N/A 02/18/2017   Procedure: UPPER ENDOSCOPIC ULTRASOUND (EUS) LINEAR;  Surgeon: [name], MD;  Location: [facility] ENDOSCOPY;  Service: Endoscopy;  Laterality: N/A;  . FINE  NEEDLE ASPIRATION N/A 02/18/2017   Procedure: FINE NEEDLE ASPIRATION (FNA) LINEAR;  Surgeon: [name], MD;  Location: [facility] ENDOSCOPY;  Service: Endoscopy;  Laterality: N/A;  pancreatic mass, needs biopsy.      Family History  Problem Relation Age of Onset  . Cancer Mother        pancreas  . Cancer Father        prostate    Social History Reviewed with no changes to be made today.   Outpatient Medications Prior to Visit  Medication Sig Dispense Refill  . enoxaparin (LOVENOX) 80 MG/0.8ML injection Inject 0.8 mLs (80 mg total) into the skin daily. 30 Syringe 1  . magnesium citrate SOLN Take 1 Bottle by mouth once as needed for moderate constipation.    . mirtazapine (REMERON) 7.5 MG tablet Take 1 tablet (7.5 mg total) by mouth at bedtime. 30 tablet 1  . oxyCODONE-acetaminophen (PERCOCET/ROXICET) 5-325 MG tablet Take 1-2 tablets by mouth every 6 (six) hours as needed for severe pain. 30 tablet 0  . senna (SENOKOT) 8.6 MG TABS tablet Take 1 tablet (8.6 mg total) by mouth 2 (two) times daily. 30 each 0  . polyethylene glycol (MIRALAX / GLYCOLAX) packet Take 17 g by mouth daily as needed for mild constipation. (Patient not taking: Reported on 02/26/2017) 14 each 0  . traZODone (DESYREL) 50 MG tablet Take 1 tablet (50 mg total) by mouth at bedtime as needed for sleep. (Patient not taking: Reported on 02/26/2017) 30 tablet 0   No facility-administered medications prior to visit.     No Known Allergies     Objective:    BP (!) 80/60 (BP Location: Right Arm, Patient Position: Sitting, Cuff Size: Small)   Pulse 100   Temp 98.6 F (37 C) (Oral)   Ht 5\' 9"  (1.753 m)   Wt 110 lb 12.8 oz (50.3 kg)   SpO2 96%   BMI 16.36 kg/m  Wt Readings from Last 3 Encounters:  02/26/17 110 lb 12.8 oz (50.3 kg)  02/23/17 111 lb 9.6 oz (50.6 kg)  02/19/17 118 lb (53.5 kg)    Physical Exam  Constitutional: He is oriented to person, place, and time. He appears well-developed and well-nourished. He  is cooperative.  HENT:  Head: Normocephalic and atraumatic.  Eyes: EOM are normal.  Neck: Normal range of motion.  Cardiovascular: Normal rate, regular rhythm, normal heart sounds and intact distal pulses. Exam reveals no gallop and no friction rub.  No murmur heard. Pulmonary/Chest: Effort normal and breath sounds normal. No tachypnea. No respiratory distress. He has no decreased breath sounds. He has no wheezes. He has no rhonchi. He has no rales. He exhibits no tenderness.  Abdominal: Soft. Bowel sounds are normal. There is tenderness in the right upper quadrant and right lower quadrant.  Musculoskeletal: Normal range of motion. He exhibits no edema.  Neurological: He is alert and oriented to person, place, and time. Coordination normal.  Skin: Skin is warm and dry.  Psychiatric: He has a normal mood and affect. His behavior is normal. Judgment and thought content normal.  Nursing note and vitals reviewed.        Patient has been counseled extensively about nutrition  and exercise as well as the importance of adherence with medications and regular follow-up. The patient was given clear instructions to go to ER or return to medical center if symptoms don't improve, worsen or new problems develop. The patient verbalized understanding.   Follow-up: No Follow-up on file.   Gildardo Pounds, FNP-BC [facility] and [place], [place]   02/26/2017, 5:07 PM

## 2017-02-26 NOTE — Patient Instructions (Addendum)
Hypotension Hypotension, commonly called low blood pressure, is when the force of blood pumping through your arteries is too weak. Arteries are blood vessels that carry blood from the heart throughout the body. When blood pressure is too low, you may not get enough blood to your brain or to the rest of your organs. This can cause weakness, light-headedness, rapid heartbeat, and fainting. Depending on the cause and severity, hypotension may be harmless (benign) or cause serious problems (critical). What are the causes? Possible causes of hypotension include:  Blood loss.  Loss of body fluids (dehydration).  Heart problems.  Hormone (endocrine) problems.  Pregnancy.  Severe infection.  Lack of certain nutrients.  Severe allergic reactions (anaphylaxis).  Certain medicines, such as blood pressure medicine or medicines that make the body lose excess fluids (diuretics). Sometimes, hypotension can be caused by not taking medicine as directed, such as taking too much of a certain medicine.  What increases the risk? Certain factors can make you more likely to develop hypotension, including:  Age. Risk increases as you get older.  Conditions that affect the heart or the central nervous system.  Taking certain medicines, such as blood pressure medicine or diuretics.  Being pregnant.  What are the signs or symptoms? Symptoms of this condition may include:  Weakness.  Light-headedness.  Dizziness.  Blurred vision.  Fatigue.  Rapid heartbeat.  Fainting, in severe cases.  How is this diagnosed? This condition is diagnosed based on:  Your medical history.  Your symptoms.  Your blood pressure measurement. Your health care provider will check your blood pressure when you are: ? Lying down. ? Sitting. ? Standing.  A blood pressure reading is recorded as two numbers, such as "120 over 80" (or 120/80). The first ("top") number is called the systolic pressure. It is a  measure of the pressure in your arteries as your heart beats. The second ("bottom") number is called the diastolic pressure. It is a measure of the pressure in your arteries when your heart relaxes between beats. Blood pressure is measured in a unit called mm Hg. Healthy blood pressure for adults is 120/80. If your blood pressure is below 90/60, you may be diagnosed with hypotension. Other information or tests that may be used to diagnose hypotension include:  Your other vital signs, such as your heart rate and temperature.  Blood tests.  Tilt table test. For this test, you will be safely secured to a table that moves you from a lying position to an upright position. Your heart rhythm and blood pressure will be monitored during the test.  How is this treated? Treatment for this condition may include:  Changing your diet. This may involve eating more salt (sodium) or drinking more water.  Taking medicines to raise your blood pressure.  Changing the dosage of certain medicines you are taking that might be lowering your blood pressure.  Wearing compression stockings. These stockings help to prevent blood clots and reduce swelling in your legs.  In some cases, you may need to go to the hospital for:  Fluid replacement. This means you will receive fluids through an IV tube.  Blood replacement. This means you will receive donated blood through an IV tube (transfusion).  Treating an infection or heart problems, if this applies.  Monitoring. You may need to be monitored while medicines that you are taking wear off.  Follow these instructions at home: Eating and drinking   Drink enough fluid to keep your urine clear or pale yellow.    Eat a healthy diet and follow instructions from your health care provider about eating or drinking restrictions. A healthy diet includes: ? Fresh fruits and vegetables. ? Whole grains. ? Lean meats. ? Low-fat dairy products.  Eat extra salt only as  directed. Do not add extra salt to your diet unless your health care provider told you to do that.  Eat frequent, small meals.  Avoid standing up suddenly after eating. Medicines  Take over-the-counter and prescription medicines only as told by your health care provider. ? Follow instructions from your health care provider about changing the dosage of your current medicines, if this applies. ? Do not stop or adjust any of your medicines on your own. General instructions  Wear compression stockings as told by your health care provider.  Get up slowly from lying down or sitting positions. This gives your blood pressure a chance to adjust.  Avoid hot showers and excessive heat as directed by your health care provider.  Return to your normal activities as told by your health care provider. Ask your health care provider what activities are safe for you.  Do not use any products that contain nicotine or tobacco, such as cigarettes and e-cigarettes. If you need help quitting, ask your health care provider.  Keep all follow-up visits as told by your health care provider. This is important. Contact a health care provider if:  You vomit.  You have diarrhea.  You have a fever for more than 2-3 days.  You feel more thirsty than usual.  You feel weak and tired. Get help right away if:  You have chest pain.  You have a fast or irregular heartbeat.  You develop numbness in any part of your body.  You cannot move your arms or your legs.  You have trouble speaking.  You become sweaty or feel light-headed.  You faint.  You feel short of breath.  You have trouble staying awake.  You feel confused. This information is not intended to replace advice given to you by your health care provider. Make sure you discuss any questions you have with your health care provider. Document Released: 02/23/2005 Document Revised: 09/13/2015 Document Reviewed: 08/15/2015 Elsevier Interactive  Patient Education  2018 Elsevier Inc.  

## 2017-02-26 NOTE — Discharge Instructions (Signed)
1. Medications: usual home medications 2. Treatment: rest, drink plenty of fluids,  3. Follow Up: Please followup with your primary doctor in 2-3 days for discussion of your diagnoses and further evaluation after today's visit; if you do not have a primary care doctor use the resource guide provided to find one; Please return to the ER for symptoms including lightheadedness, syncope, vomiting or other concerns

## 2017-02-26 NOTE — ED Provider Notes (Signed)
[facility] EMERGENCY DEPARTMENT Provider Note   CSN: [id number] Arrival date & time: 02/26/17  1649     History   Chief Complaint Chief Complaint  Patient presents with  . Hypotension    HPI Fred Bradford is a 66 y.o. male with a hx of pancreatic cancer with questionable metastatic disease to the lung versus second primary lung cancer presents to the Emergency Department from his primary care physician after being found hypotensive during his appointment.  Patient reports he has had some decreased p.o. intake over the last month with a significant amount of weight loss since his cancer diagnosis.  He states he is drinking some fluids and some boost to help with his nutrition.  He had one episode of vomiting yesterday but has not had any today.  Patient reports he has been ambulatory without lightheadedness, dizziness, syncope or near syncope today.  He has had no palpitations, chest pain, shortness of breath or dyspnea on exertion.  Patient reports he did not know he was hypotensive until going to the doctor.  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  He reports compliance with this.  He denies leg swelling, calf pain, chest pain, palpitations, shortness of breath, hemoptysis.  Patient has not yet started chemotherapy as he is waiting on lung nodule biopsy scheduled for 03/01/17.   The history is provided by the patient, medical records and a significant other. No language interpreter was used.    Past Medical History:  Diagnosis Date  . Pancreatic cancer [facility])     Patient Active Problem List   Diagnosis Date Noted  . Malignant neoplasm of body of pancreas ([place])   . Generalized abdominal pain   . Pancreatic mass 02/15/2017    Past Surgical History:  Procedure Laterality Date  . EUS N/A 02/18/2017   Procedure: UPPER ENDOSCOPIC ULTRASOUND (EUS) LINEAR;  Surgeon: [name], MD;   Location: [facility] ENDOS[facility];  Service: Endoscopy;  Laterality: N/A;  . FINE NEEDLE ASPIRATION N/A 02/18/2017   Procedure: FINE NEEDLE ASPIRATION (FNA) LINEAR;  Surgeon: [name], MD;  Location: [facility]OS[facility];  Service: Endoscopy;  Laterality: N/A;  pancreatic mass, needs biopsy.         Home Medications    Prior to Admission medications   Medication Sig Start Date End Date Taking? Authorizing Provider  enoxaparin (LOVENOX) 80 MG/0.8ML injection Inject 0.8 mLs (80 mg total) into the skin daily. 02/23/17  Yes Alla Feeling, NP  magnesium citrate SOLN Take 1 Bottle by mouth once as needed for moderate constipation.   Yes [provider]  mirtazapine (REMERON) 7.5 MG tablet Take 1 tablet (7.5 mg total) by mouth at bedtime. 02/23/17  Yes Truitt Merle, MD  oxyCODONE-acetaminophen (PERCOCET/ROXICET) 5-325 MG tablet Take 1-2 tablets by mouth every 6 (six) hours as needed for severe pain. 02/19/17  Yes Verner Mould, MD  traZODone (DESYREL) 50 MG tablet Take 50 mg by mouth at bedtime as needed for sleep. 02/17/17  Yes [provider]  polyethylene glycol (MIRALAX / GLYCOLAX) packet Take 17 g by mouth daily as needed for mild constipation. Patient not taking: Reported on 02/26/2017 02/17/17   Shirley, [place], DO  senna (SENOKOT) 8.6 MG TABS tablet Take 1 tablet (8.6 mg total) by mouth 2 (two) times daily. Patient not taking: Reported on 02/26/2017 02/17/17   Shirley, [place], DO  traZODone (DESYREL) 100 MG tablet Take 1 tablet (100 mg total)  by mouth at bedtime. 02/26/17   Gildardo Pounds, NP    Family History Family History  Problem Relation Age of Onset  . Cancer Mother        pancreas  . Cancer Father        prostate    Social History Social History   Tobacco Use  . Smoking status: Former Smoker    Types: Cigarettes    Last attempt to quit: 2017    Years since quitting: 1.9  . Smokeless tobacco: Never Used  Substance Use Topics  . Alcohol use: No     Frequency: Never    Comment: 1 beer daily, has not consumed lately   . Drug use: No     Allergies   Patient has no known allergies.   Review of Systems Review of Systems  Constitutional: Positive for appetite change. Negative for diaphoresis, fatigue, fever and unexpected weight change.  HENT: Negative for mouth sores.   Eyes: Negative for visual disturbance.  Respiratory: Negative for cough, chest tightness, shortness of breath and wheezing.   Cardiovascular: Negative for chest pain.  Gastrointestinal: Positive for abdominal pain ( Chronic for greater than 1 month) and vomiting ( x1 last night). Negative for constipation, diarrhea and nausea.  Endocrine: Negative for polydipsia, polyphagia and polyuria.  Genitourinary: Negative for dysuria, frequency, hematuria and urgency.  Musculoskeletal: Negative for back pain and neck stiffness.  Skin: Negative for rash.  Allergic/Immunologic: Negative for immunocompromised state.  Neurological: Negative for syncope, light-headedness and headaches.  Hematological: Does not bruise/bleed easily.  Psychiatric/Behavioral: Negative for sleep disturbance. The patient is not nervous/anxious.      Physical Exam Updated Vital Signs BP 110/77   Pulse 81   Temp 98.7 F (37.1 C) (Oral)   Resp (!) 21   SpO2 97%   Physical Exam  Constitutional: He appears well-developed. He appears cachectic. No distress.  Awake, alert, nontoxic appearance  HENT:  Head: Normocephalic and atraumatic.  Mouth/Throat: Oropharynx is clear and moist. Mucous membranes are dry ( Somewhat). No oropharyngeal exudate.  Eyes: Conjunctivae are normal. No scleral icterus.  Neck: Normal range of motion. Neck supple.  Cardiovascular: Normal rate, regular rhythm and intact distal pulses.  Pulses:      Radial pulses are 2+ on the right side, and 2+ on the left side.       Dorsalis pedis pulses are 2+ on the right side, and 2+ on the left side.  Pulmonary/Chest: Effort normal  and breath sounds normal. No respiratory distress. He has no wheezes.  Equal chest expansion  Abdominal: Soft. Bowel sounds are normal. He exhibits no mass. There is no tenderness. There is no rebound and no guarding.  Musculoskeletal: Normal range of motion. He exhibits no edema.  No peripheral edema, no calf tenderness  Neurological: He is alert.  Speech is clear and goal oriented Moves extremities without ataxia  Skin: Skin is warm and dry. He is not diaphoretic.  Psychiatric: He has a normal mood and affect.  Nursing note and vitals reviewed.    ED Treatments / Results  Labs (all labs ordered are listed, but only abnormal results are displayed) Labs Reviewed  BASIC METABOLIC PANEL - Abnormal; Notable for the following components:      Result Value   Chloride 95 (*)    Glucose, Bld 114 (*)    BUN 23 (*)    All other components within normal limits  URINALYSIS, ROUTINE W REFLEX MICROSCOPIC - Abnormal; Notable for the following  components:   Color, Urine AMBER (*)    APPearance HAZY (*)    All other components within normal limits  HEPATIC FUNCTION PANEL - Abnormal; Notable for the following components:   AST 48 (*)    Alkaline Phosphatase 179 (*)    All other components within normal limits  CBG MONITORING, ED - Abnormal; Notable for the following components:   Glucose-Capillary 125 (*)    All other components within normal limits  CBC  LIPASE, BLOOD  I-STAT CG4 LACTIC ACID, ED  I-STAT CG4 LACTIC ACID, ED    EKG  EKG Interpretation  Date/Time:  Friday February 26 2017 17:24:31 EST Ventricular Rate:  88 PR Interval:  166 QRS Duration: 78 QT Interval:  354 QTC Calculation: 428 R Axis:   -29 Text Interpretation:  Normal sinus rhythm Right atrial enlargement Cannot rule out Anteroseptal infarct , age undetermined Abnormal ECG No old tracing to compare Confirmed by Daleen Bo [phone]) on 02/26/2017 7:41:15 PM       Radiology No results  found.  Procedures Procedures (including critical care time)  Medications Ordered in ED Medications  sodium chloride 0.9 % bolus 1,000 mL (0 mLs Intravenous Stopped 02/26/17 2233)     Initial Impression / Assessment and Plan / ED Course  I have reviewed the triage vital signs and the nursing notes.  Pertinent labs & imaging results that were available during my care of the patient were reviewed by me and considered in my medical decision making (see chart for details).  Clinical Course as of Feb 26 2234  Fri Feb 26, 2017  2234 Patient has tolerated p.o. fluids without emesis.  He is ambulatory in the emergency department without lightheadedness or other symptoms.  [HM]    Clinical Course User Index [HM] Bradlee Heitman, Gwenlyn Perking    Presents with hypotension and tachycardia.  Highest heart rate recorded was 106.  Patient was asymptomatic with his hypertension.  This improved while in the department before intervention.  Patient with somewhat dry mucous membranes and appears malnourished.  He is not orthostatic.  He has no chest pain, shortness of breath or palpitations.  No clinical evidence of DVT.  Doubt PE at this time.  He is already taking Lovenox.  10:37 PM Patient remains asymptomatic.  He has been given fluids, has tolerated p.o. and ambulates without difficulty.  He request discharge home.  The patient is to follow-up with his primary care provider this week for repeat evaluation and is to return to the emergency department for development of any symptoms.  Patient and significant other state understanding and are in agreement with the plan.  Orthostatic VS for the past 24 hrs:  BP- Lying Pulse- Lying BP- Sitting Pulse- Sitting BP- Standing at 0 minutes Pulse- Standing at 0 minutes  02/26/17 2051 104/75 83 104/76 89 106/75 99     Final Clinical Impressions(s) / ED Diagnoses   Final diagnoses:  Other specified hypotension  Dehydration  Protein-calorie malnutrition,  unspecified severity [facility])    ED Discharge Orders    None       Agapito Games 02/26/17 2237    Daleen Bo, MD 02/27/17 0020

## 2017-02-26 NOTE — ED Provider Notes (Signed)
  Face-to-face evaluation   History: Patient here because when he saw his PCP today he was hypotensive and tachycardic.  His wife states that he has not been drinking and eating much.  He has substantial weight loss in the last month, and more in the last 2 weeks.  Physical exam: He appears older than stated age.  He is frail.  Mouth with somewhat dry mucous membranes.  Abdomen is soft and nontender.  Medical screening examination/treatment/procedure(s) were conducted as a shared visit with non-physician practitioner(s) and myself.  I personally evaluated the patient during the encounter    Daleen Bo, MD 02/27/17 [phone]

## 2017-03-01 ENCOUNTER — Ambulatory Visit
Admission: RE | Admit: 2017-03-01 | Discharge: 2017-03-01 | Disposition: A | Discharge status: Home / Self Care | Payer: BC Managed Care – PPO | Source: Ambulatory Visit | Attending: Hematology | Admitting: Hematology

## 2017-03-01 ENCOUNTER — Telehealth: Payer: Self-pay | Admitting: Hematology

## 2017-03-01 DIAGNOSIS — C259 Malignant neoplasm of pancreas, unspecified: Secondary | ICD-10-CM

## 2017-03-01 DIAGNOSIS — R918 Other nonspecific abnormal finding of lung field: Secondary | ICD-10-CM

## 2017-03-01 NOTE — Telephone Encounter (Signed)
Scheduled appt per 12/20 sch message - left message with appt date and time change - sent reminder letter in the mail with appt date and time.

## 2017-03-04 ENCOUNTER — Encounter: Payer: BC Managed Care – PPO | Admitting: Nutrition

## 2017-03-04 NOTE — Progress Notes (Signed)
  Oncology Nurse Navigator Documentation  Navigator Location: [facility] (03/04/17 0900)   )Navigator Encounter Type: Telephone (03/04/17 0900) Telephone: Fred Bradford Call (03/04/17 0900)    Called patient to confirm PET scan on 03/05/17. Patient understands that he is to be NPO for six hours prior to his PET scan. I encouraged patient to call me with questions or concerns.                                    Acuity: Level 2 (03/04/17 0900)   Acuity Level 2: Educational needs;Ongoing guidance and education throughout treatment as needed (03/04/17 0900)     Time Spent with Patient: 15 (03/04/17 0900)

## 2017-03-05 ENCOUNTER — Encounter
Admission: RE | Admit: 2017-03-05 | Discharge: 2017-03-05 | Disposition: A | Discharge status: Home / Self Care | Payer: BC Managed Care – PPO | Source: Ambulatory Visit | Attending: Hematology | Admitting: Hematology

## 2017-03-05 ENCOUNTER — Other Ambulatory Visit: Payer: Self-pay

## 2017-03-05 ENCOUNTER — Encounter (HOSPITAL_COMMUNITY): Payer: Self-pay | Admitting: *Deleted

## 2017-03-05 DIAGNOSIS — R918 Other nonspecific abnormal finding of lung field: Secondary | ICD-10-CM | POA: Insufficient documentation

## 2017-03-05 DIAGNOSIS — C259 Malignant neoplasm of pancreas, unspecified: Secondary | ICD-10-CM | POA: Diagnosis present

## 2017-03-05 LAB — GLUCOSE, CAPILLARY: Glucose-Capillary: 104 mg/dL — ABNORMAL HIGH (ref 65–99)

## 2017-03-05 MED ORDER — FLUDEOXYGLUCOSE F - 18 (FDG) INJECTION
12.1000 | Freq: Once | INTRAVENOUS | Status: AC | PRN
  Administered 2017-03-05: 12.1 via INTRAVENOUS

## 2017-03-05 MED ORDER — CHLORHEXIDINE GLUCONATE CLOTH 2 % EX PADS: 6.0000 | MEDICATED_PAD | Freq: Once | CUTANEOUS | Status: DC

## 2017-03-05 MED ORDER — CEFAZOLIN SODIUM-DEXTROSE 2-4 GM/100ML-% IV SOLN: 2.0000 g | INTRAVENOUS | Status: DC

## 2017-03-08 ENCOUNTER — Encounter: Payer: Self-pay | Admitting: Hematology

## 2017-03-08 ENCOUNTER — Encounter: Payer: Self-pay | Admitting: Medical Oncology

## 2017-03-08 ENCOUNTER — Ambulatory Visit (HOSPITAL_BASED_OUTPATIENT_CLINIC_OR_DEPARTMENT_OTHER): Payer: BC Managed Care – PPO | Admitting: Hematology

## 2017-03-08 VITALS — BP 94/65 | HR 104 | Temp 98.9°F | Resp 16 | Ht 69.0 in | Wt 112.1 lb

## 2017-03-08 DIAGNOSIS — G893 Neoplasm related pain (acute) (chronic): Secondary | ICD-10-CM | POA: Diagnosis not present

## 2017-03-08 DIAGNOSIS — G4700 Insomnia, unspecified: Secondary | ICD-10-CM

## 2017-03-08 DIAGNOSIS — I959 Hypotension, unspecified: Secondary | ICD-10-CM | POA: Diagnosis not present

## 2017-03-08 DIAGNOSIS — C786 Secondary malignant neoplasm of retroperitoneum and peritoneum: Secondary | ICD-10-CM

## 2017-03-08 DIAGNOSIS — R911 Solitary pulmonary nodule: Secondary | ICD-10-CM

## 2017-03-08 DIAGNOSIS — K5900 Constipation, unspecified: Secondary | ICD-10-CM | POA: Diagnosis not present

## 2017-03-08 DIAGNOSIS — Z7189 Other specified counseling: Secondary | ICD-10-CM

## 2017-03-08 DIAGNOSIS — C251 Malignant neoplasm of body of pancreas: Secondary | ICD-10-CM | POA: Diagnosis not present

## 2017-03-08 MED ORDER — OXYCODONE HCL 5 MG PO TABS: 5.0000 mg | ORAL_TABLET | ORAL | 0 refills | Status: DC | PRN

## 2017-03-08 MED ORDER — ONDANSETRON HCL 8 MG PO TABS: 8.0000 mg | ORAL_TABLET | Freq: Two times a day (BID) | ORAL | 1 refills | Status: DC | PRN

## 2017-03-08 MED ORDER — LIDOCAINE-PRILOCAINE 2.5-2.5 % EX CREA: TOPICAL_CREAM | CUTANEOUS | 3 refills | Status: DC

## 2017-03-08 MED ORDER — PROCHLORPERAZINE MALEATE 10 MG PO TABS: 10.0000 mg | ORAL_TABLET | Freq: Four times a day (QID) | ORAL | 1 refills | Status: DC | PRN

## 2017-03-08 NOTE — Progress Notes (Signed)
START ON PATHWAY REGIMEN - Pancreatic     A cycle is every 28 days:     Nab-paclitaxel (protein bound)      Gemcitabine   **Always confirm dose/schedule in your pharmacy ordering system**    Patient Characteristics: Adenocarcinoma, Metastatic Disease, First Line, PS ? 2 Histology: Adenocarcinoma Current evidence of distant metastases<= Yes AJCC T Category: T3 AJCC N Category: N2 AJCC M Category: M1 AJCC 8 Stage Grouping: IV Line of Therapy: First Line Would you be surprised if this patient died  in the next year<= I would NOT be surprised if this patient died in the next year Intent of Therapy: Non-Curative / Palliative Intent, Discussed with Patient

## 2017-03-08 NOTE — Progress Notes (Signed)
[place]  [phone] [phone]  Follow Up note   Patient Care Team: Gildardo Pounds, NP as PCP - General (Nurse Practitioner) Truitt Merle, MD as Consulting Physician (Hematology) [name], MD as Consulting Physician (General Surgery) Alla Feeling, NP as Nurse Practitioner (Nurse Practitioner) 03/08/2017   CHIEF COMPLAINT: F/u metastatic pancreas cancer  SUMMARY OF ONCOLOGIC HISTORY: Oncology History   Cancer Staging Malignant neoplasm of body of pancreas [facility]) Staging form: Exocrine Pancreas, AJCC 8th Edition - Clinical stage from 02/18/2017: Stage IV (cT3, cN2, cM1) - Signed by Truitt Merle, MD on 03/08/2017       Malignant neoplasm of body of pancreas [facility])    Initial Diagnosis    Malignant neoplasm of body of pancreas ([place])      02/15/2017 Imaging    CT AP w contrast IMPRESSION: 1. Mass within the body of the pancreas measuring approximately 3.8 cm in greatest diameter. 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 The pancreatic mass causes distal atrophy and ductal dilatation of the tail of the pancreas. There may be metastatic spread to a gastrohepatic lymph node measuring approximately 2 cm. Margination of this potential metastatic lymph node is very difficult due to the lack of intra-abdominal fat and further soft tissue delineation may be possible with MRI of the abdomen. 2. Probable trace ascites in the peritoneal cavity. 3. Small adjacent calcifications along the subpleural/pleural left posterior hemithorax has a benign appearance and represents either 2 adjacent calcified granulomata or partially calcified pleural plaque.       02/15/2017 Imaging    MR abdomen w/wo contrast    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  Bulky peripancreatic adenopathy with extension into the porta hepatis. This causes obstruction of left intrahepatic bile ducts.      02/18/2017 Initial Biopsy    Diagnosis FINE NEEDLE ASPIRATION, ENDOSCOPIC, PANCREAS, BODY (SPECIMEN 1 OF 1 COLLECTED 02/18/17): MALIGNANT CELLS CONSISTENT WITH ADENOCARCINOMA.      02/22/2017 Imaging    CT chest w contrast IMPRESSION: 1. Bilateral subsolid and partially solid nodules in addition to a solid 1.3 by 1.1 cm right upper lobe nodule, appearance suspicious for malignancy. Given the spiculated and subsolid appearance of some of these nodules, I cannot exclude a second primary lung malignancy in addition to the known pancreatic lesion. 2. Known pancreatic mass with stable findings in the upper abdomen. 3. 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 4.  Aortic Atherosclerosis (ICD10-I70.0).        03/05/2017 Imaging    PET Scan IMPRESSION: Hypermetabolic mass in the pancreatic body, consistent with known pancreatic carcinoma. Hypermetabolic abdominal lymphadenopathy, consistent with metastatic disease. Multiple peritoneal metastases throughout the abdomen and pelvis. Minimal ascites. Several bilateral hypermetabolic pulmonary nodules, highly suspicious for pulmonary metastases.      HISTORY OF PRESENTING ILLNESS: Fred Bradford began feeling "uneasy" in his middle abdomen in early November that developed into persistent pain. He was lifting a heavy box at work 02/12/17 and experienced acutely increased pain. He presented to urgent care few days after and was referred to oncologist. However, pain escalated and he went to ED 02/15/17. From onset of symptoms he has lost  approx 40 pounds due to decreased appetite. He has no significant past medical or  surgical history. He has family history positive for pancreas cancer (mother), and prostate cancer (father). He has 2 sons who are alive and healthy. He smoked cigarettes for 50 years, quit in 2017 but has recently smoked few cigarettes due to stress over his diagnosis. He lives with significant other. He works at [employer] and is independent with ADLs and activities. He drives himself.   Fred Bradford presents for first f/u since hospital discharge on 02/17/17. Has been resting a lot lately, not currently working. Reports non-radiating pain in middle abdomen 4/10. He takes 2 percocet q6 hours PRN, improves pain to 1/10. Mild constipation, senna does not work. He maintains regular BM with mag citrate; denies nausea, vomiting, diarrhea. Has been taking trazadone for insomnia since hospital d/c which has been helping but last night could not sleep. His mood is positive.    CURRENT THERAPY: pending firs line chemo Gemcitabine and Abraxane on day 1,8, every 21 days   INTERVAL HISTORY:  Fred Bradford presents to the office today for follow up. He is accompanied by his partner today. He reports that he is doing well overall.   His partner voices concern for if the patient should continue with his anticoagulant injections prior to his biopsy on 03/11/2017 at 8:45 AM. He denies any bleeding at the injection site of anticoagulant injections. His partner notes that the patient is out of the oxycodone and he takes it every 6 hours and averages 4-5 tablets a day due to abdominal pain.   Since his last visit to the office, he underwent a PET scan on 03/05/2017 with results showing: IMPRESSION: Hypermetabolic mass in the pancreatic body, consistent with known pancreatic carcinoma. Hypermetabolic abdominal lymphadenopathy, consistent with metastatic disease. Multiple peritoneal metastases throughout the abdomen and pelvis. Minimal ascites. Several bilateral hypermetabolic pulmonary nodules, highly suspicious for  pulmonary metastases.  Prior to, he was evaluated by his PCP on 02/26/2017 and was seen in the ED on the same day due to palpitations and hypotension, he notes that his symptoms stabilized once he got into the ED.    On review of systems, pt denies dizziness, fever, chills. He is using Ensure and Boost and he is not able to chew a lot due to his dental issues. He has a Pharmacist, community and is unable to make an appointment prior to chemotherapy due to finances. He reports that he has been eating protein to aid with his dental issues. He reports weight gain (2 lbs) as well as increased belching. He reports abdominal pain as well.     REVIEW OF SYSTEMS:   Constitutional: Denies fevers, chills (+) abnormal weight loss (+) decreased appetite  Eyes: Denies blurriness of vision Ears, nose, mouth, throat, and face: Denies mucositis or sore throat Respiratory: Denies cough, dyspnea or wheezes Cardiovascular: Denies palpitation, chest discomfort or lower extremity swelling Gastrointestinal:  Denies nausea, vomiting, constipation, diarrhea, heartburn or change in bowel habits (+) Abdominal pain.  Skin: Denies abnormal skin rashes Lymphatics: Denies new lymphadenopathy or easy bruising Neurological:Denies numbness, tingling or new weaknesses Behavioral/Psych: Mood is stable, no new changes  All other systems were reviewed with the patient and are negative.  MEDICAL HISTORY:  Past Medical History:  Diagnosis Date  . Pancreatic cancer [facility])     SURGICAL HISTORY: Past Surgical History:  Procedure Laterality Date  . EUS N/A 02/18/2017   Procedure: UPPER ENDOSCOPIC ULTRASOUND (EUS) LINEAR;  Surgeon: Owens Loffler  P, MD;  Location: [facility] ENDOSCOPY;  Service: Endoscopy;  Laterality: N/A;  . FINE NEEDLE ASPIRATION N/A 02/18/2017   Procedure: FINE NEEDLE ASPIRATION (FNA) LINEAR;  Surgeon: [name], MD;  Location: [facility] ENDOSCOPY;  Service: Endoscopy;  Laterality: N/A;  pancreatic mass, needs biopsy.      I  have reviewed the social history and family history with the patient and they are unchanged from previous note.  ALLERGIES:  has No Known Allergies.  MEDICATIONS:  Current Outpatient Medications  Medication Sig Dispense Refill  . enoxaparin (LOVENOX) 80 MG/0.8ML injection Inject 0.8 mLs (80 mg total) into the skin daily. 30 Syringe 1  . magnesium citrate SOLN Take 1 Bottle by mouth once as needed for moderate constipation.    Marland Kitchen oxyCODONE-acetaminophen (PERCOCET/ROXICET) 5-325 MG tablet Take 1-2 tablets by mouth every 6 (six) hours as needed for severe pain. 30 tablet 0  . senna (SENOKOT) 8.6 MG TABS tablet Take 1 tablet (8.6 mg total) by mouth 2 (two) times daily. 30 each 0  . traZODone (DESYREL) 100 MG tablet Take 1 tablet (100 mg total) by mouth at bedtime. 30 tablet 0  . oxyCODONE (OXY IR/ROXICODONE) 5 MG immediate release tablet Take 1 tablet (5 mg total) by mouth every 4 (four) hours as needed for severe pain. 90 tablet 0   No current facility-administered medications for this visit.     PHYSICAL EXAMINATION:  ECOG PERFORMANCE STATUS: 2 - Symptomatic, <50% confined to bed  Vitals:   03/08/17 0831  BP: 94/65  Pulse: (!) 104  Resp: 16  Temp: 98.9 F (37.2 C)  SpO2: 99%   Filed Weights   03/08/17 0831  Weight: 112 lb 1.6 oz (50.8 kg)    GENERAL:alert, no distress and comfortable (+) cachectic appearance SKIN: skin color, texture, turgor are normal, no rashes or significant lesions EYES: normal, Conjunctiva are pink and non-injected, sclera clear OROPHARYNX:no exudate, no erythema and lips, buccal mucosa, and tongue normal  NECK: supple, thyroid normal size, non-tender, without nodularity LYMPH:  no palpable cervical, supraclavicular, axillary, or inguinal lymphadenopathy  LUNGS: clear to auscultation bilaterally with normal breathing effort HEART: regular rate & rhythm and no murmurs and no lower extremity edema ABDOMEN:abdomen soft, non-tender and normal bowel  sounds Musculoskeletal:no cyanosis of digits and no clubbing  NEURO: alert & oriented x 3 with fluent speech, no focal motor/sensory deficits  LABORATORY DATA:  I have reviewed the data as listed CBC Latest Ref Rng & Units 02/26/2017 02/16/2017 02/15/2017  WBC 4.0 - 10.5 K/uL 8.4 7.6 8.3  Hemoglobin 13.0 - 17.0 g/dL 14.3 12.9(L) 14.0  Hematocrit 39.0 - 52.0 % 43.2 39.3 42.1  Platelets 150 - 400 K/uL 374 315 350    CMP Latest Ref Rng & Units 02/26/2017 02/16/2017 02/15/2017  Glucose 65 - 99 mg/dL 114(H) 109(H) 116(H)  BUN 6 - 20 mg/dL 23(H) 12 17  Creatinine 0.61 - 1.24 mg/dL 0.84 0.69 0.94  Sodium 135 - 145 mmol/L 135 133(L) 136  Potassium 3.5 - 5.1 mmol/L 4.2 3.8 3.6  Chloride 101 - 111 mmol/L 95(L) 102 102  CO2 22 - 32 mmol/L 31 25 27   Calcium 8.9 - 10.3 mg/dL 9.8 8.8(L) 9.3  Total Protein 6.5 - 8.1 g/dL 7.9 6.3(L) 7.6  Total Bilirubin 0.3 - 1.2 mg/dL 0.7 0.7 0.7  Alkaline Phos 38 - 126 U/L 179(H) 116 125  AST 15 - 41 U/L 48(H) 30 31  ALT 17 - 63 U/L 53 34 39    RADIOGRAPHIC STUDIES:  I have personally reviewed the radiological images as listed and agreed with the findings in the report. X-ray Chest Pa And Lateral  Result Date: 02/15/2017 CLINICAL DATA:  Bilateral lower lobe ronchi EXAM: CHEST  2 VIEW COMPARISON:  None. FINDINGS: The heart size and mediastinal contours are within normal limits. Aortic atherosclerosis is noted at the arch. The lungs are mildly hyperinflated without pulmonary consolidation, CHF nor effusion. Minimal scarring at the left lung apex. The visualized skeletal structures are unremarkable. IMPRESSION: No active cardiopulmonary disease. Mild pulmonary hyperinflation. Aortic atherosclerosis. Electronically Signed   By: Ashley Royalty M.D.   On: 02/15/2017 22:43   Ct Chest W Contrast  Result Date: 02/22/2017 CLINICAL DATA:  Newly diagnosed pancreatic cancer, staging assessment. Weight loss. EXAM: CT CHEST WITH CONTRAST TECHNIQUE: Multidetector CT imaging of  the chest was performed during intravenous contrast administration. CONTRAST:  35m ISOVUE-300 IOPAMIDOL (ISOVUE-300) INJECTION 61% COMPARISON:  CT and MRI examinations from 02/15/2017 FINDINGS: Cardiovascular: Atherosclerotic calcification of the aortic arch. Mediastinum/Nodes: No pathologic mediastinal adenopathy. Lungs/Pleura: Solid 1.3 by 1.1 cm right upper lobe nodule with mildly spiculated borders, image 82/3. Part solid right middle lobe pulmonary nodule measuring 1.0 by 0.9 cm on image 106/3. Sub solid right upper lobe pulmonary nodule 1.3 by 1.9 cm on image 60/3. Sub solid left upper lobe pulmonary nodule measuring 3.0 by 1.8 cm on image 32/3. This has a confluent more solid portion inferiorly measuring 1.0 by 0.7 cm, image 36/3. 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 Some of these are calcified. No pleural effusion. Upper Abdomen: Pancreatic mass with dilated dorsal pancreatic duct in the pancreatic tail, as noted on recent prior dedicated abdominal imaging exams. Gastric varices reconstitute the splenic vein. Left hepatic duct obstruction due to adenopathy in the porta hepatis. Indistinct fat planes in the porta hepatis along with periportal edema. Indistinct potential adenopathy in the right gastric chain. Musculoskeletal: No findings of osseous metastatic disease. We partially image the known deformity at the L2 vertebral level with bridging spurring at L1-2. IMPRESSION: 1. Bilateral subsolid and partially solid nodules in addition to a solid 1.3 by 1.1 cm right upper lobe nodule, appearance suspicious for malignancy. Given the spiculated and subsolid appearance of some of these nodules, I cannot exclude a second primary lung malignancy in addition to the known pancreatic lesion. 2. Known pancreatic mass with stable findings in the upper abdomen. 3. 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 4.  Aortic Atherosclerosis (ICD10-I70.0).  Electronically Signed   By: WVan ClinesM.D.   On: 02/22/2017 12:31   Mr Abdomen W Wo Contrast  Result Date: 02/16/2017 CLINICAL DATA:  Paraumbilical pain and anorexia. Pancreatic lesion seen on previous CT. EXAM: MRI ABDOMEN WITHOUT AND WITH CONTRAST TECHNIQUE: Multiplanar multisequence MR imaging of the abdomen was performed both before and after the administration of intravenous contrast. CONTRAST:  110mMULTIHANCE GADOBENATE DIMEGLUMINE 529 MG/ML IV SOLN COMPARISON:  CT on 02/15/2017 FINDINGS: Lower Chest: No acute findings. Hepatobiliary: No intrahepatic masses are identified. Intrahepatic biliary ductal dilatation is seen within left hepatic lobe which appears to be due to lymphadenopathy in the porta hepatis. Pancreas: A ill-defined hypovascular mass with central necrosis is seen in body which measures approximately 4.3 x 2.9 cm on image 24/3. This causes pancreatic ductal dilatation pancreatic tail. 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 Spleen: Within normal limits in size and appearance. Adrenals/Urinary Tract: No masses identified. Several tiny renal cysts noted bilaterally.  No evidence of hydronephrosis. Stomach/Bowel: No evidence of obstruction, inflammatory process or abnormal fluid collections. Vascular/Lymphatic: Peripancreatic lymphadenopathy seen in the gastrohepatic and hepatoduodenal ligaments, and extending into porta hepatis. The largest area in the porta hepatis measuring 2.9 cm short axis on image 33/ 1204. This causes obstruction of intrahepatic bile ducts within the left lobe. Other:  None. Musculoskeletal:  No suspicious bone lesions identified. 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 Bulky peripancreatic adenopathy with extension into the porta hepatis. This causes obstruction of left intrahepatic bile  ducts. Electronically Signed   By: Earle Gell M.D.   On: 02/16/2017 08:29   Ct Abdomen Pelvis W Contrast  Result Date: 02/15/2017 CLINICAL DATA:  Periumbilical abdominal pain, constipation and decreased appetite. EXAM: CT ABDOMEN AND PELVIS WITH CONTRAST TECHNIQUE: Multidetector CT imaging of the abdomen and pelvis was performed using the standard protocol following bolus administration of intravenous contrast. CONTRAST:  139m ISOVUE-300 IOPAMIDOL (ISOVUE-300) INJECTION 61% COMPARISON:  None. FINDINGS: Lower chest: Focal calcifications in the posterior left hemithorax appear to abut the pleural surface and are associated with some mild soft tissue thickening. This likely represents partially calcified pleural plaque versus adjacent calcified granulomata. Hepatobiliary: The liver appears unremarkable. No focal hepatic mass lesions or evidence of biliary dilatation. The gallbladder is contracted. Pancreas: There is a mass within the body of the pancreas measuring approximately 3.8 x 2.7 x 3.6 cm. This mass causes obstruction of the splenic vein and also encases a segment of the splenic artery. The mass also exerts mass effect on the confluence of the superior mesenteric and portal veins. The pancreatic tail shows atrophy with associated ductal dilatation. Spleen: The spleen is normal in size. Perisplenic short gastric collateral veins are seen which eventually drain into an epigastric collateral varix that drains into the superior mesenteric vein. 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 Adrenals/Urinary Tract: No adrenal masses identified. The kidneys appear unremarkable and show no evidence of hydronephrosis, masses or calculi. Ureters and bladder are unremarkable. Stomach/Bowel: No evidence of bowel obstruction. The stomach is mildly distended with fluid. Vascular/Lymphatic: There is soft tissue nodularity superior to the splenic artery and  medial to the stomach. This may represent an enlarged gastrohepatic lymph node. Margination of this soft tissue is very vague due to lack of abdominal fat. The abdominal aorta and common iliac artery is demonstrate scattered calcified plaque without evidence of aneurysmal disease. The aorta is moderately tortuous. Reproductive: Prostate is unremarkable. Other: There may be a trace amount of ascites in the peritoneal cavity. It is difficult to delineate free-fluid due to lack of intra-abdominal fat. Musculoskeletal: Degenerative disc disease of the lumbar spine present with a chronic appearing compression fracture of the L2 vertebral body. Associated scoliosis. IMPRESSION: 1. Mass within the body of the pancreas measuring approximately 3.8 cm in greatest diameter. 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 The pancreatic mass causes distal atrophy and ductal dilatation of the tail of the pancreas. There may be metastatic spread to a gastrohepatic lymph node measuring approximately 2 cm. Margination of this potential metastatic lymph node is very difficult due to the lack of intra-abdominal fat  and further soft tissue delineation may be possible with MRI of the abdomen. 2. Probable trace ascites in the peritoneal cavity. 3. Small adjacent calcifications along the subpleural/pleural left posterior hemithorax has a benign appearance and represents either 2 adjacent calcified granulomata or partially calcified pleural plaque. Electronically Signed   By: Aletta Edouard M.D.   On: 02/15/2017 15:49   Nm Pet Image Initial (pi) Skull Base To Thigh  Result Date: 03/05/2017 CLINICAL DATA:  Initial treatment strategy for pancreatic adenocarcinoma. Bilateral pulmonary nodules. EXAM: NUCLEAR  MEDICINE PET SKULL BASE TO THIGH TECHNIQUE: 12.1 mCi F-18 FDG was injected intravenously. Full-ring PET imaging was performed from the skull base to thigh after the radiotracer. CT data was obtained and used for attenuation correction and anatomic localization. FASTING BLOOD GLUCOSE:  Value: 104 mg/dl COMPARISON:  CT on 02/22/2017 and 02/15/2017 FINDINGS: NECK:  No hypermetabolic lymph nodes or masses. CHEST: No hypermetabolic lymphadenopathy. 10 mm spiculated nodule left upper lobe has SUV max of 4.0. 11 mm solid pulmonary nodule in inferior right upper lobe has SUV max of 1.8. 8 mm pulmonary nodule in right middle lobe has SUV max of 3.2. 17 mm ground-glass opacity in the posterior right upper lobe shows no significant FDG uptake. No evidence of pleural effusion. Mild calcified pleural plaque noted bilaterally, consistent with asbestos related pleural disease. ABDOMEN/PELVIS: CT images for correlation are limited due to lack of intra-abdominal fat. Soft tissue mass in pancreatic body is hypermetabolic with SUV max of 5.6. Peripancreatic and porta hepatis lymphadenopathy shows hypermetabolic activity, with index lymph node in the porta hepatis showing SUV max of 6.0. There are also multiple small hypermetabolic peritoneal soft tissue densities throughout the abdomen and pelvis, consistent with peritoneal metastases. Index lesion in the dependent pelvis measures 4.0 cm has SUV max of 5.3. Trace amount of ascites noted. SKELETON: No focal hypermetabolic bone lesions to suggest skeletal metastasis. IMPRESSION: Hypermetabolic mass in the pancreatic body, consistent with known pancreatic carcinoma. Hypermetabolic abdominal lymphadenopathy, consistent with metastatic disease. Multiple peritoneal metastases throughout the abdomen and pelvis . Minimal ascites. Several bilateral hypermetabolic pulmonary nodules, highly suspicious for pulmonary metastases. Electronically Signed   By: Earle Gell M.D.   On: 03/05/2017 10:12      ASSESSMENT & PLAN: 66 y.o. male with no past medical history presented with vague abdominal pain now with adenocarcinoma of the pancreas and multiple lung nodules found on staging work up.  1. Adenocarcinoma of body of pancreas, with peritoneal and lung metastasis, cT3N2M1, stage IV  -We reviewed imaging and pathology results in detail. The biopsy of the 4.3 cm mass in the body of the pancreas demonstrated adenocarcinoma, we discussed the usually aggressive nature of his disease.  -CT indicates bulky peripancreatic adenopathy with extension into the porta hepatis which is causing obstruction of left intrahepatic bile ducts. He has at least locally advanced disease, but likely metastatic, as there are multiple lung nodules found on CT chest suspicious for metastatic pancreatic cancer vs other primary lung cancer.  -I reviewed his PET scan findings, which showed hypermetabolic peritoneum metastasis and hypermetabolic lung nodules, which are likely metastatic disease.  --he is scheduled for lung mass biopsy on March 11, 2017 by interventional radiologist -History of pancreatic cancer, and incurable nature of metastatic disease.  Patient understand the overall very poor prognosis.  -Surgery is not an option at this stage.  I discussed the treatment options for metastatic pancreatic cancer, which is largely chemotherapy.  I will check his tumor for MMR/MSI to  see if he is a candidate for immunotherapy, and also recommend him to see genetic counseling for testing including BRCA1/2, to see if he is a candidate for PARP inhibitor. -The systemic chemotherapy options, including single agent gemcitabine, combination of gemcitabine and Abraxane, and FOLFIRINOX.  Given his overall poor nutrition status and limited performance status, I recommend him to consider gemcitabine and Abraxane as first-line therapy. --Chemotherapy consent: Side effects including but does not not limited to, fatigue, nausea, vomiting,  diarrhea, hair loss, neuropathy, fluid retention, renal and kidney dysfunction, neutropenic fever, needed for blood transfusion, bleeding, were discussed with patient in great detail. Pt voiced good understanding, but is reluctant to take chemotherapy, but agrees to consider.  -Discussed and recommended the clinical trial, CanStem 111P.  Evaluate by our research nurse today, unfortunately is not eligible for the trial due to his low BMI. -Advised that the patient continue use of at least 2-3 Ensure a day, along with remaining active with a positive mentality to aid with chemotherapy. -He is scheduled for a port a cath with Dr. Barry Dienes on 03/16/2017.  -He is scheduled for his first chemotherapy treatment on 03/17/2017. Advised that the patient could began treatment sooner on 03/13/2017 if he agree, to which the patient declined at this time.   -Will prescribe Oxycodone 5 MG every 4 hours for his pain control.  -Follow up on 03/16/2017 to discuss biopsy and consent for chemo   2. 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  -CT chest indicates bilateral subsolid and partially solid nodules in addition to a solid 1.3 by 1.1 cm right upper lobe nodule, appearance suspicious for malignancy and a second lung primary could not be ruled out.  -I reviewed his PET scan results.  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  He is scheduled to have a biopsy to confirm.  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  -His pancreatic cancer places him at higher risk for thromboembolism.  -Will anticoagulate with lovenox 80 mg/day. He will receive first injection here with teaching per RN. -Advised the patient and his partner to discontinue use of lovenox on 03/10/2017 and 03/11/2017 prior to CT biopsy and then to continue use on  03/12/2017.  -We reviewed s/sx to report such as any bleeding   4. Weight loss, moderate protein and calorie malnutrition -He has cachetic appearance with 40 pounds weight loss in 1.5 months due to decreased appetite. -His partner has been making him protein smoothies, I encouraged him to continue this and to add 2-3 boost or ensure per day to add calories to his diet. He needs to gain weight in order to tolerate chemotherapy. -His partner has continued to feed the patient protein rich foods and soft foods to aid with his nutrition, due to this, the patient has gained 2 lbs since his last visit on 02/26/2017.  -I prescribed Remeron to improve appetite, this will also help his insomnia, previously treated with trazadone; his mood is good now.  5.  Borderline hypotension -Related to low p.o. Intake -Patient to drink more fluids, and add some salt in his diet.  6. Abdominal pain -Secondary to his underlying pancreatic cancer -He has been taking Percocet as needed.  I prescribed oxycodone 5 mg every 4 hours as needed for pain today.  7. Goal of care discussion  -  We again discussed the incurable nature of his cancer, and the overall poor prognosis, especially if he does not have good response to chemotherapy or progress on chemo -The patient understands the goal of care is palliative. -I recommend DNR/DNI, she will think about it   PLAN: -CT biopsy on 03/11/2017 -Port placement on 03/16/2017 -chemo class  -Follow up on 03/16/2017 to finalize his chemo Gemcitabine and Abraxane  -Start chemotherapy on 03/17/2017    No orders of the defined types were placed in this encounter.  All questions were answered. The patient knows to call the clinic with any problems, questions or concerns. No barriers to learning was detected.  I spent 30 minutes counseling the patient face to face. The total time spent in the appointment was 40 minutes and more than 50% was on counseling, review of test results, and  coordination of care.      Truitt Merle, MD 03/08/17   This document serves as a record of services personally performed by Truitt Merle, MD. It was created on her behalf by Steva Colder, a trained medical scribe. The creation of this record is based on the scribe's personal observations and the provider's statements to them.   I have reviewed the above documentation for accuracy and completeness, and I agree with the above.

## 2017-03-09 DIAGNOSIS — R17 Unspecified jaundice: Secondary | ICD-10-CM

## 2017-03-09 DIAGNOSIS — R918 Other nonspecific abnormal finding of lung field: Secondary | ICD-10-CM

## 2017-03-09 HISTORY — DX: Unspecified jaundice: R17

## 2017-03-09 HISTORY — DX: Other nonspecific abnormal finding of lung field: R91.8

## 2017-03-10 ENCOUNTER — Encounter: Payer: Self-pay | Admitting: *Deleted

## 2017-03-10 ENCOUNTER — Other Ambulatory Visit: Payer: Self-pay | Admitting: Radiology

## 2017-03-10 NOTE — Progress Notes (Signed)
Oncology Nurse Navigator Documentation  Oncology Nurse Navigator Flowsheets 03/10/2017  Navigator Location [facility]  Navigator Encounter Type Other/I followed up with Dr. Burr Medico regarding Mr. Fred Bradford schedule. She states she would like to see him on 03/15/17.  I completed a scheduling message for scheduling to call and arrange appt for patient.   Treatment Phase Pre-Tx/Tx Discussion  Barriers/Navigation Needs Coordination of Care  Interventions Coordination of Care  Coordination of Care Other  Acuity Level 2  Time Spent with Patient 15

## 2017-03-11 ENCOUNTER — Ambulatory Visit (HOSPITAL_COMMUNITY): Payer: BC Managed Care – PPO

## 2017-03-11 ENCOUNTER — Ambulatory Visit (HOSPITAL_COMMUNITY)
Admission: RE | Admit: 2017-03-11 | Discharge: 2017-03-11 | Disposition: A | Discharge status: Home / Self Care | Payer: BC Managed Care – PPO | Source: Ambulatory Visit | Attending: Nurse Practitioner | Admitting: Nurse Practitioner

## 2017-03-11 ENCOUNTER — Ambulatory Visit (HOSPITAL_COMMUNITY)
Admission: RE | Admit: 2017-03-11 | Discharge: 2017-03-11 | Disposition: A | Discharge status: Home / Self Care | Payer: BC Managed Care – PPO | Source: Ambulatory Visit | Attending: Diagnostic Radiology | Admitting: Diagnostic Radiology

## 2017-03-11 ENCOUNTER — Telehealth: Payer: Self-pay | Admitting: Hematology

## 2017-03-11 DIAGNOSIS — C259 Malignant neoplasm of pancreas, unspecified: Secondary | ICD-10-CM | POA: Insufficient documentation

## 2017-03-11 DIAGNOSIS — Z87891 Personal history of nicotine dependence: Secondary | ICD-10-CM | POA: Diagnosis not present

## 2017-03-11 DIAGNOSIS — Z9889 Other specified postprocedural states: Secondary | ICD-10-CM

## 2017-03-11 DIAGNOSIS — C3411 Malignant neoplasm of upper lobe, right bronchus or lung: Secondary | ICD-10-CM | POA: Insufficient documentation

## 2017-03-11 DIAGNOSIS — Z800 Family history of malignant neoplasm of digestive organs: Secondary | ICD-10-CM | POA: Diagnosis not present

## 2017-03-11 DIAGNOSIS — Z8042 Family history of malignant neoplasm of prostate: Secondary | ICD-10-CM | POA: Diagnosis not present

## 2017-03-11 DIAGNOSIS — C251 Malignant neoplasm of body of pancreas: Secondary | ICD-10-CM

## 2017-03-11 DIAGNOSIS — Z79899 Other long term (current) drug therapy: Secondary | ICD-10-CM | POA: Insufficient documentation

## 2017-03-11 DIAGNOSIS — R911 Solitary pulmonary nodule: Secondary | ICD-10-CM | POA: Diagnosis present

## 2017-03-11 DIAGNOSIS — R918 Other nonspecific abnormal finding of lung field: Secondary | ICD-10-CM

## 2017-03-11 LAB — CBC
HEMATOCRIT: 42.1 % (ref 39.0–52.0)
Hemoglobin: 14 g/dL (ref 13.0–17.0)
MCH: 28.6 pg (ref 26.0–34.0)
MCHC: 33.3 g/dL (ref 30.0–36.0)
MCV: 85.9 fL (ref 78.0–100.0)
Platelets: 426 10*3/uL — ABNORMAL HIGH (ref 150–400)
RBC: 4.9 MIL/uL (ref 4.22–5.81)
RDW: 14.4 % (ref 11.5–15.5)
WBC: 6.7 10*3/uL (ref 4.0–10.5)

## 2017-03-11 LAB — PROTIME-INR
INR: 0.96
Prothrombin Time: 12.7 seconds (ref 11.4–15.2)

## 2017-03-11 LAB — APTT: aPTT: 31 seconds (ref 24–36)

## 2017-03-11 MED ORDER — BOOST PLUS PO LIQD
237.0000 mL | Freq: Three times a day (TID) | ORAL | Status: DC
  Filled 2017-03-11: qty 237

## 2017-03-11 MED ORDER — FENTANYL CITRATE (PF) 100 MCG/2ML IJ SOLN
INTRAMUSCULAR | Status: AC | PRN
  Administered 2017-03-11: 50 ug via INTRAVENOUS

## 2017-03-11 MED ORDER — BOOST / RESOURCE BREEZE PO LIQD CUSTOM
1.0000 | Freq: Once | ORAL | Status: DC
  Filled 2017-03-11: qty 1

## 2017-03-11 MED ORDER — FENTANYL CITRATE (PF) 100 MCG/2ML IJ SOLN
INTRAMUSCULAR | Status: AC
  Filled 2017-03-11: qty 4

## 2017-03-11 MED ORDER — SODIUM CHLORIDE 0.9 % IV SOLN: INTRAVENOUS | Status: DC

## 2017-03-11 MED ORDER — MIDAZOLAM HCL 2 MG/2ML IJ SOLN
INTRAMUSCULAR | Status: AC
  Filled 2017-03-11: qty 4

## 2017-03-11 MED ORDER — LIDOCAINE HCL 1 % IJ SOLN
INTRAMUSCULAR | Status: AC
  Filled 2017-03-11: qty 20

## 2017-03-11 MED ORDER — MIDAZOLAM HCL 2 MG/2ML IJ SOLN
INTRAMUSCULAR | Status: AC | PRN
  Administered 2017-03-11: 1 mg via INTRAVENOUS

## 2017-03-11 NOTE — Discharge Instructions (Signed)
Lung Biopsy, Care After °This sheet gives you information about how to care for yourself after your procedure. Your health care provider may also give you more specific instructions depending on the type of biopsy you had. If you have problems or questions, contact your health care provider. °What can I expect after the procedure? °After the procedure, it is common to have: °· A cough. °· A sore throat. °· Pain where a needle, bronchoscope, or incision was used to collect a biopsy sample (biopsy site). ° °Follow these instructions at home: °Medicines °· Take over-the-counter and prescription medicines only as told by your health care provider. °· Do not drive for 24 hours if you were given a sedative. °· Do not drink alcohol while taking pain medicine. °· Do not drive or use heavy machinery while taking prescription pain medicine. °· To prevent or treat constipation while you are taking prescription pain medicine, your health care provider may recommend that you: °? Drink enough fluid to keep your urine clear or pale yellow. °? Take over-the-counter or prescription medicines. °? Eat foods that are high in fiber, such as fresh fruits and vegetables, whole grains, and beans. °? Limit foods that are high in fat and processed sugars, such as fried and sweet foods. °Activity °· If you had an incision during your procedure, avoid activities that may pull the incision site open. °· Return to your normal activities as told by your health care provider. Ask your health care provider what activities are safe for you. °If you had an open biopsy:  °· Follow instructions from your health care provider about how to take care of your incision. Make sure you: °? Wash your hands with soap and water before you change your bandage (dressing). If soap and water are not available, use hand sanitizer. °? Change your dressing as told by your health care provider. °? Leave stitches (sutures), skin glue, or adhesive strips in place. These  skin closures may need to stay in place for 2 weeks or longer. If adhesive strip edges start to loosen and curl up, you may trim the loose edges. Do not remove adhesive strips completely unless your health care provider tells you to do that. °· Check your incision area every day for signs of infection. Check for: °? Redness, swelling, or pain. °? Fluid or blood. °? Warmth. °? Pus or a bad smell. °General instructions °· It is up to you to get the results of your procedure. Ask your health care provider, or the department that is doing the procedure, when your results will be ready. °Contact a health care provider if: °· You have a fever. °· You have redness, swelling, or pain around your biopsy site. °· You have fluid or blood coming from your biopsy site. °· Your biopsy site feels warm to the touch. °· You have pus or a bad smell coming from your biopsy site. °Get help right away if: °· You cough up blood. °· You have trouble breathing. °· You have chest pain. °Summary °· After the procedure, it is common to have a sore throat and a cough. °· Return to your normal activities as told by your health care provider. Ask your health care provider what activities are safe for you. °· Take over-the-counter and prescription medicines only as told by your health care provider. °· Report any unusual symptoms to your health care provider. °This information is not intended to replace advice given to you by your health care provider. Make sure   you discuss any questions you have with your health care provider. °Document Released: 03/24/2016 Document Revised: 03/24/2016 Document Reviewed: 03/24/2016 °Elsevier Interactive Patient Education © 2018 [employer] ° °

## 2017-03-11 NOTE — Telephone Encounter (Signed)
Scheduled appts per 12/31 sch msg - moved chemo ed class and followup to 1/7 due to availability. I even got approval from Abigail Butts to take the class at 12 so that I could coordinate with Dr.Feng's schedule. When I called his wife, she had gotten upset that it was moved and did not understand why. I could not answer the clinical answers - so I transferred her to leave a voicemail for Frederick.

## 2017-03-11 NOTE — Sedation Documentation (Signed)
Patient is resting comfortably. 

## 2017-03-11 NOTE — Procedures (Signed)
CT guided core biopsy of RUL nodule.   2 cores obtained.  Minimal blood loss and no immediate complication.  Bedrest 2 hours.  CXR in one hour.

## 2017-03-11 NOTE — H&P (Signed)
Chief Complaint: Patient was seen in consultation today for pulmonary nodule biopsy at the request of Burton,Lacie K  Referring Physician(s): [place]  Supervising Physician: Markus Daft  Patient Status: [facility] - Out-pt  History of Present Illness: Fred Bradford is a 67 y.o. male with pancreatic cancer. His recent PET also shows hypermetabolic pulm nodules concerning for mets vs pulm primary. His case was discussed at tumor board and he is now scheduled for CT guided pulm nodule biopsy. PMHx, meds, labs, imaging reviewed. Pt did receive Lovenox yesterday morning at 0900. Has been NPO this am Feels well. Family at bedside   Past Medical History:  Diagnosis Date  . Pancreatic cancer [facility])     Past Surgical History:  Procedure Laterality Date  . EUS N/A 02/18/2017   Procedure: UPPER ENDOSCOPIC ULTRASOUND (EUS) LINEAR;  Surgeon: [name], MD;  Location: [facility] ENDOSCOPY;  Service: Endoscopy;  Laterality: N/A;  . FINE NEEDLE ASPIRATION N/A 02/18/2017   Procedure: FINE NEEDLE ASPIRATION (FNA) LINEAR;  Surgeon: [name], MD;  Location: [facility] ENDOSCOPY;  Service: Endoscopy;  Laterality: N/A;  pancreatic mass, needs biopsy.      Allergies: Patient has no known allergies.  Medications: Prior to Admission medications   Medication Sig Start Date End Date Taking? Authorizing Provider  enoxaparin (LOVENOX) 80 MG/0.8ML injection Inject 0.8 mLs (80 mg total) into the skin daily. 02/23/17  Yes Alla Feeling, NP  magnesium citrate SOLN Take 1 Bottle by mouth once as needed for moderate constipation.   Yes [provider]  oxyCODONE (OXY IR/ROXICODONE) 5 MG immediate release tablet Take 1 tablet (5 mg total) by mouth every 4 (four) hours as needed for severe pain. 03/08/17  Yes Truitt Merle, MD  oxyCODONE-acetaminophen (PERCOCET/ROXICET) 5-325 MG tablet Take 1-2 tablets by mouth every 6 (six) hours as needed for severe pain. 02/19/17  Yes Verner Mould,  MD  traZODone (DESYREL) 100 MG tablet Take 1 tablet (100 mg total) by mouth at bedtime. 02/26/17  Yes Gildardo Pounds, NP  lidocaine-prilocaine (EMLA) cream Apply to affected area once 03/08/17   Truitt Merle, MD  ondansetron (ZOFRAN) 8 MG tablet Take 1 tablet (8 mg total) by mouth 2 (two) times daily as needed (Nausea or vomiting). 03/08/17   Truitt Merle, MD  prochlorperazine (COMPAZINE) 10 MG tablet Take 1 tablet (10 mg total) by mouth every 6 (six) hours as needed (Nausea or vomiting). 03/08/17   Truitt Merle, MD  senna (SENOKOT) 8.6 MG TABS tablet Take 1 tablet (8.6 mg total) by mouth 2 (two) times daily. 02/17/17   Shirley, [place], DO     Family History  Problem Relation Age of Onset  . Cancer Mother        pancreas  . Cancer Father        prostate    Social History   Socioeconomic History  . Marital status: Single    Spouse name: Not on file  . Number of children: Not on file  . Years of education: Not on file  . Highest education level: Not on file  Social Needs  . Financial resource strain: Not on file  . Food insecurity - worry: Not on file  . Food insecurity - inability: Not on file  . Transportation needs - medical: Not on file  . Transportation needs - non-medical: Not on file  Occupational History  . Not on file  Tobacco Use  . Smoking status: Former Smoker    Packs/day: 1.00  Years: 40.00    Pack years: 40.00    Types: Cigarettes    Last attempt to quit: 2017    Years since quitting: 2.0  . Smokeless tobacco: Never Used  Substance and Sexual Activity  . Alcohol use: No    Frequency: Never    Comment: 1 beer daily, has not consumed lately   . Drug use: No  . Sexual activity: Not on file  Other Topics Concern  . Not on file  Social History Narrative  . Not on file    Review of Systems: A 12 point ROS discussed and pertinent positives are indicated in the HPI above.  All other systems are negative.  Review of Systems  Vital Signs: BP 100/77 (BP  Location: Right Arm)   Pulse 97   Temp 98.2 F (36.8 C) (Oral)   Ht 5\' 9"  (1.753 m)   Wt 112 lb (50.8 kg)   SpO2 98%   BMI 16.54 kg/m   Physical Exam  Constitutional: He is oriented to person, place, and time. He appears well-developed. No distress.  HENT:  Head: Normocephalic.  Mouth/Throat: Oropharynx is clear and moist.  Neck: Normal range of motion. No JVD present. No tracheal deviation present.  Cardiovascular: Normal rate, regular rhythm and normal heart sounds.  Pulmonary/Chest: Effort normal and breath sounds normal. No respiratory distress.  Neurological: He is alert and oriented to person, place, and time.  Skin: Skin is warm and dry.  Psychiatric: He has a normal mood and affect.    Imaging: X-ray Chest Pa And Lateral  Result Date: 02/15/2017 CLINICAL DATA:  Bilateral lower lobe ronchi EXAM: CHEST  2 VIEW COMPARISON:  None. FINDINGS: The heart size and mediastinal contours are within normal limits. Aortic atherosclerosis is noted at the arch. The lungs are mildly hyperinflated without pulmonary consolidation, CHF nor effusion. Minimal scarring at the left lung apex. The visualized skeletal structures are unremarkable. IMPRESSION: No active cardiopulmonary disease. Mild pulmonary hyperinflation. Aortic atherosclerosis. Electronically Signed   By: Ashley Royalty M.D.   On: 02/15/2017 22:43   Ct Chest W Contrast  Result Date: 02/22/2017 CLINICAL DATA:  Newly diagnosed pancreatic cancer, staging assessment. Weight loss. EXAM: CT CHEST WITH CONTRAST TECHNIQUE: Multidetector CT imaging of the chest was performed during intravenous contrast administration. CONTRAST:  81mL ISOVUE-300 IOPAMIDOL (ISOVUE-300) INJECTION 61% COMPARISON:  CT and MRI examinations from 02/15/2017 FINDINGS: Cardiovascular: Atherosclerotic calcification of the aortic arch. Mediastinum/Nodes: No pathologic mediastinal adenopathy. Lungs/Pleura: Solid 1.3 by 1.1 cm right upper lobe nodule with mildly spiculated  borders, image 82/3. Part solid right middle lobe pulmonary nodule measuring 1.0 by 0.9 cm on image 106/3. Sub solid right upper lobe pulmonary nodule 1.3 by 1.9 cm on image 60/3. Sub solid left upper lobe pulmonary nodule measuring 3.0 by 1.8 cm on image 32/3. This has a confluent more solid portion inferiorly measuring 1.0 by 0.7 cm, image 36/3. 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 Some of these are calcified. No pleural effusion. Upper Abdomen: Pancreatic mass with dilated dorsal pancreatic duct in the pancreatic tail, as noted on recent prior dedicated abdominal imaging exams. Gastric varices reconstitute the splenic vein. Left hepatic duct obstruction due to adenopathy in the porta hepatis. Indistinct fat planes in the porta hepatis along with periportal edema. Indistinct potential adenopathy in the right gastric chain. Musculoskeletal: No findings of osseous metastatic disease. We partially image the known deformity at the L2 vertebral level with bridging spurring at L1-2. IMPRESSION: 1. Bilateral subsolid  and partially solid nodules in addition to a solid 1.3 by 1.1 cm right upper lobe nodule, appearance suspicious for malignancy. Given the spiculated and subsolid appearance of some of these nodules, I cannot exclude a second primary lung malignancy in addition to the known pancreatic lesion. 2. Known pancreatic mass with stable findings in the upper abdomen. 3. 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 4.  Aortic Atherosclerosis (ICD10-I70.0). Electronically Signed   By: Van Clines M.D.   On: 02/22/2017 12:31   Mr Abdomen W Wo Contrast  Result Date: 02/16/2017 CLINICAL DATA:  Paraumbilical pain and anorexia. Pancreatic lesion seen on previous CT. EXAM: MRI ABDOMEN WITHOUT AND WITH CONTRAST TECHNIQUE: Multiplanar multisequence MR imaging of the abdomen was performed both before and after the administration of intravenous  contrast. CONTRAST:  8mL MULTIHANCE GADOBENATE DIMEGLUMINE 529 MG/ML IV SOLN COMPARISON:  CT on 02/15/2017 FINDINGS: Lower Chest: No acute findings. Hepatobiliary: No intrahepatic masses are identified. Intrahepatic biliary ductal dilatation is seen within left hepatic lobe which appears to be due to lymphadenopathy in the porta hepatis. Pancreas: A ill-defined hypovascular mass with central necrosis is seen in body which measures approximately 4.3 x 2.9 cm on image 24/3. This causes pancreatic ductal dilatation pancreatic tail. 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 Spleen: Within normal limits in size and appearance. Adrenals/Urinary Tract: No masses identified. Several tiny renal cysts noted bilaterally. No evidence of hydronephrosis. Stomach/Bowel: No evidence of obstruction, inflammatory process or abnormal fluid collections. Vascular/Lymphatic: Peripancreatic lymphadenopathy seen in the gastrohepatic and hepatoduodenal ligaments, and extending into porta hepatis. The largest area in the porta hepatis measuring 2.9 cm short axis on image 33/ 1204. This causes obstruction of intrahepatic bile ducts within the left lobe. Other:  None. Musculoskeletal:  No suspicious bone lesions identified. 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 Bulky peripancreatic adenopathy with extension into the porta hepatis. This causes obstruction of left intrahepatic bile ducts. Electronically Signed   By: Earle Gell M.D.   On: 02/16/2017 08:29   Ct Abdomen Pelvis W Contrast  Result Date: 02/15/2017 CLINICAL DATA:  Periumbilical abdominal pain, constipation and decreased appetite. EXAM: CT ABDOMEN AND PELVIS WITH CONTRAST TECHNIQUE: Multidetector CT imaging of the abdomen and pelvis was performed using the standard protocol following bolus administration  of intravenous contrast. CONTRAST:  141mL ISOVUE-300 IOPAMIDOL (ISOVUE-300) INJECTION 61% COMPARISON:  None. FINDINGS: Lower chest: Focal calcifications in the posterior left hemithorax appear to abut the pleural surface and are associated with some mild soft tissue thickening. This likely represents partially calcified pleural plaque versus adjacent calcified granulomata. Hepatobiliary: The liver appears unremarkable. No focal hepatic mass lesions or evidence of biliary dilatation. The gallbladder is contracted. Pancreas: There is a mass within the body of the pancreas measuring approximately 3.8 x 2.7 x 3.6 cm. This mass causes obstruction of the splenic vein and also encases a segment of the splenic artery. The mass also exerts mass effect on the confluence of the superior mesenteric and portal veins. The pancreatic tail shows atrophy with associated ductal dilatation. Spleen: The spleen is normal in size. Perisplenic short gastric collateral veins are seen which eventually drain into an epigastric collateral varix that drains into the superior mesenteric vein. 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 Adrenals/Urinary Tract: No adrenal masses identified. The kidneys  appear unremarkable and show no evidence of hydronephrosis, masses or calculi. Ureters and bladder are unremarkable. Stomach/Bowel: No evidence of bowel obstruction. The stomach is mildly distended with fluid. Vascular/Lymphatic: There is soft tissue nodularity superior to the splenic artery and medial to the stomach. This may represent an enlarged gastrohepatic lymph node. Margination of this soft tissue is very vague due to lack of abdominal fat. The abdominal aorta and common iliac artery is demonstrate scattered calcified plaque without evidence of aneurysmal disease. The aorta is moderately tortuous. Reproductive: Prostate is unremarkable. Other: There may be a trace amount of ascites  in the peritoneal cavity. It is difficult to delineate free-fluid due to lack of intra-abdominal fat. Musculoskeletal: Degenerative disc disease of the lumbar spine present with a chronic appearing compression fracture of the L2 vertebral body. Associated scoliosis. IMPRESSION: 1. Mass within the body of the pancreas measuring approximately 3.8 cm in greatest diameter. 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 The pancreatic mass causes distal atrophy and ductal dilatation of the tail of the pancreas. There may be metastatic spread to a gastrohepatic lymph node measuring approximately 2 cm. Margination of this potential metastatic lymph node is very difficult due to the lack of intra-abdominal fat and further soft tissue delineation may be possible with MRI of the abdomen. 2. Probable trace ascites in the peritoneal cavity. 3. Small adjacent calcifications along the subpleural/pleural left posterior hemithorax has a benign appearance and represents either 2 adjacent calcified granulomata or partially calcified pleural plaque. Electronically Signed   By: Aletta Edouard M.D.   On: 02/15/2017 15:49   Nm Pet Image Initial (pi) Skull Base To Thigh  Result Date: 03/05/2017 CLINICAL DATA:  Initial treatment strategy for pancreatic adenocarcinoma. Bilateral pulmonary nodules. EXAM: NUCLEAR MEDICINE PET SKULL BASE TO THIGH TECHNIQUE: 12.1 mCi F-18 FDG was injected intravenously. Full-ring PET imaging was performed from the skull base to thigh after the radiotracer. CT data was obtained and used for attenuation correction and anatomic localization. FASTING BLOOD GLUCOSE:  Value: 104 mg/dl COMPARISON:  CT on 02/22/2017 and 02/15/2017 FINDINGS: NECK:  No hypermetabolic lymph nodes or masses.  CHEST: No hypermetabolic lymphadenopathy. 10 mm spiculated nodule left upper lobe has SUV max of 4.0. 11 mm solid pulmonary nodule in inferior right upper lobe has SUV max of 1.8. 8 mm pulmonary nodule in right middle lobe has SUV max of 3.2. 17 mm ground-glass opacity in the posterior right upper lobe shows no significant FDG uptake. No evidence of pleural effusion. Mild calcified pleural plaque noted bilaterally, consistent with asbestos related pleural disease. ABDOMEN/PELVIS: CT images for correlation are limited due to lack of intra-abdominal fat. Soft tissue mass in pancreatic body is hypermetabolic with SUV max of 5.6. Peripancreatic and porta hepatis lymphadenopathy shows hypermetabolic activity, with index lymph node in the porta hepatis showing SUV max of 6.0. There are also multiple small hypermetabolic peritoneal soft tissue densities throughout the abdomen and pelvis, consistent with peritoneal metastases. Index lesion in the dependent pelvis measures 4.0 cm has SUV max of 5.3. Trace amount of ascites noted. SKELETON: No focal hypermetabolic bone lesions to suggest skeletal metastasis. IMPRESSION: Hypermetabolic mass in the pancreatic body, consistent with known pancreatic carcinoma. Hypermetabolic abdominal lymphadenopathy, consistent with metastatic disease. Multiple peritoneal metastases throughout the abdomen and pelvis . Minimal  ascites. Several bilateral hypermetabolic pulmonary nodules, highly suspicious for pulmonary metastases. Electronically Signed   By: Earle Gell M.D.   On: 03/05/2017 10:12    Labs:  CBC: Recent Labs    02/15/17 1232 02/16/17 0532 02/26/17 1738  WBC 8.3 7.6 8.4  HGB 14.0 12.9* 14.3  HCT 42.1 39.3 43.2  PLT 350 315 374    COAGS: No results for input(s): INR, APTT in the last 8760 hours.  BMP: Recent Labs    02/15/17 1232 02/16/17 0532 02/26/17 1738  NA 136 133* 135  K 3.6 3.8 4.2  CL 102 102 95*  CO2 27 25 31   GLUCOSE 116* 109* 114*  BUN 17 12  23*  CALCIUM 9.3 8.8* 9.8  CREATININE 0.94 0.69 0.84  GFRNONAA >60 >60 >60  GFRAA >60 >60 >60    LIVER FUNCTION TESTS: Recent Labs    02/15/17 1232 02/16/17 0532 02/26/17 1738  BILITOT 0.7 0.7 0.7  AST 31 30 48*  ALT 39 34 53  ALKPHOS 125 116 179*  PROT 7.6 6.3* 7.9  ALBUMIN 3.8 3.2* 4.1    TUMOR MARKERS: No results for input(s): AFPTM, CEA, CA199, CHROMGRNA in the last 8760 hours.  Assessment and Plan: Pulmonary nodules, plan for CT guided biopsy Hx of pancreatic cancer. Labs pending Risks and benefits discussed with the patient including, but not limited to bleeding, hemoptysis, respiratory failure requiring intubation, infection, pneumothorax requiring chest tube placement, stroke from air embolism or even death. All of the patient's questions were answered, patient is agreeable to proceed. Consent signed and in chart.    Thank you for this interesting consult.  I greatly enjoyed meeting Brainard Highfill and look forward to participating in their care.  A copy of this report was sent to the requesting provider on this date.  Electronically Signed: Ascencion Dike, PA-C 03/11/2017, 10:55 AM   I spent a total of 20 minutes in face to face in clinical consultation, greater than 50% of which was counseling/coordinating care for lung nodule biopsy

## 2017-03-11 NOTE — Sedation Documentation (Signed)
Patient denies pain and is resting comfortably.  

## 2017-03-12 ENCOUNTER — Ambulatory Visit: Payer: BC Managed Care – PPO | Admitting: Hematology

## 2017-03-12 ENCOUNTER — Other Ambulatory Visit: Payer: BC Managed Care – PPO

## 2017-03-12 NOTE — Progress Notes (Signed)
  Oncology Nurse Navigator Documentation  Navigator Location: [facility] (03/12/17 0932)   )Navigator Encounter Type: Telephone (03/12/17 0932) Telephone: Outgoing Call (03/12/17 0932)    Called patient's wife to review appointments and to review nausea medications.Patient has had difficulty with nausea during meals and "is not able to eat."   Patient encouraged to take a PRN nausea medication 30 minutes prior to eating. Mrs.Epple verbalized understanding that she can call me with questions or concerns at any time.                 Treatment Phase: Pre-Tx/Tx Discussion (03/12/17 0932) Barriers/Navigation Needs: Coordination of Care (03/12/17 0932)   Interventions: Coordination of Care;Education (03/12/17 0932)   Coordination of Care: Other (03/12/17 0932)        Acuity: Level 2 (03/12/17 0932)         Time Spent with Patient: 30 (03/12/17 0932)

## 2017-03-15 ENCOUNTER — Inpatient Hospital Stay: Payer: BC Managed Care – PPO

## 2017-03-15 ENCOUNTER — Inpatient Hospital Stay (HOSPITAL_BASED_OUTPATIENT_CLINIC_OR_DEPARTMENT_OTHER): Payer: BC Managed Care – PPO | Admitting: Nurse Practitioner

## 2017-03-15 ENCOUNTER — Encounter: Payer: Self-pay | Admitting: Nurse Practitioner

## 2017-03-15 ENCOUNTER — Inpatient Hospital Stay: Payer: BC Managed Care – PPO | Attending: Hematology

## 2017-03-15 ENCOUNTER — Telehealth: Payer: Self-pay | Admitting: Gastroenterology

## 2017-03-15 ENCOUNTER — Telehealth: Payer: Self-pay | Admitting: Emergency Medicine

## 2017-03-15 VITALS — BP 98/69 | HR 109 | Temp 98.4°F | Resp 20 | Ht 69.0 in | Wt 104.7 lb

## 2017-03-15 DIAGNOSIS — R630 Anorexia: Secondary | ICD-10-CM

## 2017-03-15 DIAGNOSIS — R188 Other ascites: Secondary | ICD-10-CM | POA: Diagnosis not present

## 2017-03-15 DIAGNOSIS — Z452 Encounter for adjustment and management of vascular access device: Secondary | ICD-10-CM | POA: Diagnosis not present

## 2017-03-15 DIAGNOSIS — I700 Atherosclerosis of aorta: Secondary | ICD-10-CM | POA: Diagnosis not present

## 2017-03-15 DIAGNOSIS — R1013 Epigastric pain: Secondary | ICD-10-CM | POA: Insufficient documentation

## 2017-03-15 DIAGNOSIS — R17 Unspecified jaundice: Secondary | ICD-10-CM

## 2017-03-15 DIAGNOSIS — R634 Abnormal weight loss: Secondary | ICD-10-CM | POA: Insufficient documentation

## 2017-03-15 DIAGNOSIS — Z87891 Personal history of nicotine dependence: Secondary | ICD-10-CM | POA: Insufficient documentation

## 2017-03-15 DIAGNOSIS — C786 Secondary malignant neoplasm of retroperitoneum and peritoneum: Secondary | ICD-10-CM

## 2017-03-15 DIAGNOSIS — C251 Malignant neoplasm of body of pancreas: Secondary | ICD-10-CM

## 2017-03-15 DIAGNOSIS — G893 Neoplasm related pain (acute) (chronic): Secondary | ICD-10-CM | POA: Diagnosis not present

## 2017-03-15 DIAGNOSIS — C3411 Malignant neoplasm of upper lobe, right bronchus or lung: Secondary | ICD-10-CM | POA: Insufficient documentation

## 2017-03-15 DIAGNOSIS — I82890 Acute embolism and thrombosis of other specified veins: Secondary | ICD-10-CM | POA: Insufficient documentation

## 2017-03-15 DIAGNOSIS — G4700 Insomnia, unspecified: Secondary | ICD-10-CM

## 2017-03-15 DIAGNOSIS — D735 Infarction of spleen: Secondary | ICD-10-CM | POA: Diagnosis not present

## 2017-03-15 DIAGNOSIS — C259 Malignant neoplasm of pancreas, unspecified: Secondary | ICD-10-CM

## 2017-03-15 DIAGNOSIS — K56609 Unspecified intestinal obstruction, unspecified as to partial versus complete obstruction: Secondary | ICD-10-CM | POA: Insufficient documentation

## 2017-03-15 DIAGNOSIS — C3491 Malignant neoplasm of unspecified part of right bronchus or lung: Secondary | ICD-10-CM

## 2017-03-15 HISTORY — DX: Malignant neoplasm of unspecified part of right bronchus or lung: C34.91

## 2017-03-15 LAB — CMP (CANCER CENTER ONLY)
ALBUMIN: 3.6 g/dL (ref 3.5–5.0)
ALT: 146 U/L — AB (ref 0–55)
AST: 92 U/L — AB (ref 5–34)
Alkaline Phosphatase: 354 U/L — ABNORMAL HIGH (ref 40–150)
Anion gap: 9 (ref 3–11)
BILIRUBIN TOTAL: 7.9 mg/dL — AB (ref 0.2–1.2)
BUN: 22 mg/dL (ref 7–26)
CO2: 29 mmol/L (ref 22–29)
CREATININE: 0.88 mg/dL (ref 0.70–1.30)
Calcium: 9.9 mg/dL (ref 8.4–10.4)
Chloride: 98 mmol/L (ref 98–109)
GFR, Est AFR Am: >60 mL/min (ref >60)
GLUCOSE: 126 mg/dL (ref 70–140)
Potassium: 4.3 mmol/L (ref 3.5–5.1)
Sodium: 136 mmol/L (ref 136–145)
TOTAL PROTEIN: 7.6 g/dL (ref 6.4–8.3)

## 2017-03-15 LAB — CBC WITH DIFFERENTIAL (CANCER CENTER ONLY)
ABS GRANULOCYTE: 4.5 10*3/uL (ref 1.5–6.5)
BASOS ABS: 0 10*3/uL (ref 0.0–0.1)
BASOS PCT: 0 %
Eosinophils Absolute: 0 10*3/uL (ref 0.0–0.5)
Eosinophils Relative: 0 %
HEMATOCRIT: 41.6 % (ref 38.4–49.9)
HEMOGLOBIN: 13.7 g/dL (ref 13.0–17.1)
LYMPHS PCT: 31 %
Lymphs Abs: 2.2 10*3/uL (ref 0.9–3.3)
MCH: 28.3 pg (ref 27.2–33.4)
MCHC: 33 g/dL (ref 32.0–36.0)
MCV: 85.8 fL (ref 79.3–98.0)
Monocytes Absolute: 0.4 10*3/uL (ref 0.1–0.9)
Monocytes Relative: 5 %
NEUTROS ABS: 4.5 10*3/uL (ref 1.5–6.5)
NEUTROS PCT: 64 %
Platelet Count: 399 10*3/uL (ref 140–400)
RBC: 4.85 MIL/uL (ref 4.20–5.82)
RDW: 13.9 % (ref 11.0–15.6)
WBC: 7 10*3/uL (ref 4.0–10.3)

## 2017-03-15 MED ORDER — DRONABINOL 2.5 MG PO CAPS: 2.5000 mg | ORAL_CAPSULE | Freq: Two times a day (BID) | ORAL | 0 refills | Status: DC

## 2017-03-15 NOTE — Telephone Encounter (Addendum)
Patients [place] scheduled for 1/8 @ 8am. Patient verbalized understanding of this.   ----- Message from Alla Feeling, NP sent at 03/15/2017  2:40 PM EST ----- Dr. [name],   We saw this patient in f/u today prior to initiating chemo for pancreatic cancer. Lung biopsy also confirmed primary lung adeno. He has juandice with bili 7.9 today. We are in the process of scheduling stat abd [place], can you please take a look and see what you think. He is due to undergo [facility] placement with Dr. Barry Dienes 1/8 and begin single agent gemcitabine 1/9 but may delay.  Hommer Cunliffe, please respond with date/time for abdominal US.   Thanks, Regan Rakers

## 2017-03-15 NOTE — H&P (Signed)
Fred Bradford Documented: 02/22/2017 4:35 PM Location: [place] [facility] Patient #: [id number] DOB: 07/12/1950 Single / Language: Fred Bradford / Race: Black or African American Male   History of Present Illness [name] MD; 02/22/2017 5:23 PM) The patient is a 67 year old male who presents with pancreatic cancer. Patient is a 67 year old male who is referred by Dr. [name] with a new diagnosis of adenocarcinoma of the pancreatic body. The patient presented to the emergency department with severe epigastric pain and weight loss. He underwent CT scanning which demonstrated a pancreatic mass. There was concern for invasion of the splenic artery and vein. He subsequently underwent MRI and endoscopic ultrasound which confirmed the mass. There was loss of interface between the mass and the portal vein. He did have invasion of the splenic artery and vein. He underwent staging with CT scan today and was found to have some concerning lung findings. He continues to have significant difficulty eating and with weight loss. He has reasonable pain control taking 2 Percocet twice a day. He was unable to sleep or move much at all prior to starting narcotics. He denies significant nausea, but finds that when he eats he has pain. He denies diarrhea and in fact has significant constipation. He is finding that the only thing that allows him to have a bowel movement is magnesium citrate. he denies any personal or family history of cancer. She does not have a history of diabetes. He had been working until recently with this hospitalization last week.   CT abd/pelvis 12/10 IMPRESSION: 1. Mass within the body of the pancreas measuring approximately 3.8 cm in greatest diameter. 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 Splenic vein occlusion causes short gastric collaterals to empty via a prominent varix in the anterior abdomen that eventually  drains into the superior mesenteric vein. The pancreatic mass causes distal atrophy and ductal dilatation of the tail of the pancreas. There may be metastatic spread to a gastrohepatic lymph node measuring approximately 2 cm. Margination of this potential metastatic lymph node is very difficult due to the lack of intra-abdominal fat and further soft tissue delineation may be possible with MRI of the abdomen. 2. Probable trace ascites in the peritoneal cavity. 3. Small adjacent calcifications along the subpleural/pleural left posterior hemithorax has a benign appearance and represents either 2 adjacent calcified granulomata or partially calcified pleural plaque.  MR abd 02/16/2017 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  Bulky peripancreatic adenopathy with extension into the porta hepatis. This causes obstruction of left intrahepatic bile ducts.  CT chest 02/22/17 IMPRESSION: 1. Bilateral subsolid and partially solid nodules in addition to a solid 1.3 by 1.1 cm right upper lobe nodule, appearance suspicious for malignancy. Given the spiculated and subsolid appearance of some of these nodules, I cannot exclude a second primary lung malignancy in addition to the known pancreatic lesion. 2. Known pancreatic mass with stable findings in the upper abdomen. 3. 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 4. Aortic Atherosclerosis (ICD10-I70.0).  EUS 02/18/2017 Irregularly shaped, 3.5cm mass in the pancreatic causing main pancreatic duct obstruction/dilation. Preliminary cytology review was positive for malignancy (adenocarcinoma). There was evidence of portal vein involvement (loss of normal tissue interface between the mass and the vein). SMA, celiac trunk, SMV appear clear of involvement. The splenic vessels are occluded and  there  is significant porto-systemic neovascularization in the abdomen as a result.  Cytology 02/18/17 Diagnosis FINE NEEDLE ASPIRATION, ENDOSCOPIC, PANCREAS, BODY (SPECIMEN 1 OF 1 COLLECTED 02/18/17): MALIGNANT CELLS CONSISTENT WITH ADENOCARCINOMA.  CBc, CMET- HGb 12.9, Alb 3.2, otherwise essentially normal.     Past Surgical History (Fred Bradford, CMA; 02/22/2017 4:35 PM) No pertinent past surgical history   Diagnostic Studies History (Fred Bradford, CMA; 02/22/2017 4:35 PM) Colonoscopy  never  Allergies (Fred Bradford, CMA; 02/22/2017 4:36 PM) No Known Drug Allergies [02/22/2017]:  Medication History (Fred Bradford, CMA; 02/22/2017 4:36 PM) Oxycodone-Acetaminophen (5-325MG  Tablet, Oral) Active. [facility] Senna Plus (8.6-50MG  Tablet, Oral) Active. TraZODone HCl (50MG  Tablet, Oral) Active. Polyethylene Glycol 3350 (Oral) Active. Medications Reconciled  Social History (Fred Bradford, CMA; 02/22/2017 4:35 PM) Alcohol use  Occasional alcohol use. Caffeine use  Carbonated beverages, Tea. Illicit drug use  Uses socially only. Tobacco use  Current some day smoker.  Family History (Fred Bradford, [place]; 02/22/2017 4:35 PM) Alcohol Abuse  Father.  Other Problems (Fred Bradford, CMA; 02/22/2017 4:35 PM) Back Pain     Review of Systems (Fred Bradford CMA; 02/22/2017 4:35 PM) General Present- Appetite Loss and Weight Loss. Not Present- Chills, Fatigue, Fever, Night Sweats and Weight Gain. HEENT Present- Wears glasses/contact lenses. Not Present- Earache, Hearing Loss, Hoarseness, Nose Bleed, Oral Ulcers, Ringing in the Ears, Seasonal Allergies, Sinus Pain, Sore Throat, Visual Disturbances and Yellow Eyes. Respiratory Not Present- Bloody sputum, Chronic Cough, Difficulty Breathing, Snoring and Wheezing. Breast Not Present- Breast Mass, Breast Pain, Nipple Discharge and Skin Changes. Cardiovascular Not Present- Chest Pain, Difficulty Breathing Lying Down, Leg Cramps, Palpitations,  Rapid Heart Rate, Shortness of Breath and Swelling of Extremities. Gastrointestinal Present- Abdominal Pain, Bloating, Change in Bowel Habits, Constipation, Excessive gas and Indigestion. Not Present- Bloody Stool, Chronic diarrhea, Difficulty Swallowing, Gets full quickly at meals, Hemorrhoids, Nausea, Rectal Pain and Vomiting. Male Genitourinary Present- Urine Leakage. Not Present- Blood in Urine, Change in Urinary Stream, Frequency, Impotence, Nocturia, Painful Urination and Urgency. Musculoskeletal Not Present- Back Pain, Joint Pain, Joint Stiffness, Muscle Pain, Muscle Weakness and Swelling of Extremities. Neurological Not Present- Decreased Memory, Fainting, Headaches, Numbness, Seizures, Tingling, Tremor, Trouble walking and Weakness. Psychiatric Present- Change in Sleep Pattern. Not Present- Anxiety, Bipolar, Depression, Fearful and Frequent crying. Endocrine Not Present- Cold Intolerance, Excessive Hunger, Hair Changes, Heat Intolerance, Hot flashes and New Diabetes. Hematology Not Present- Blood Thinners, Easy Bruising, Excessive bleeding, Gland problems, HIV and Persistent Infections.  Vitals (Fred Bradford CMA; 02/22/2017 4:37 PM) 02/22/2017 4:36 PM Weight: 114 lb Height: 69in Body Surface Area: 1.63 m Body Mass Index: 16.83 kg/m  Temp.: 98.83F(Oral)  Pulse: 117 (Regular)  P.OX: 98% (Room air) BP: 90/70 (Sitting, Left Arm, Standard)       Physical Exam [name] MD; 02/22/2017 5:24 PM) General Mental Status-Alert. General Appearance-Consistent with stated age. Build & Nutrition-Cachectic, Lean and Poorly nourished. Hydration-Well hydrated. Voice-Normal. Note: severly cachectic.   Head and Neck Head-normocephalic, atraumatic with no lesions or palpable masses. Trachea-midline. Thyroid Gland Characteristics - normal size and consistency.  Eye Eyeball - Bilateral-Extraocular movements intact. Sclera/Conjunctiva - Bilateral-No  scleral icterus.  Chest and Lung Exam Chest and lung exam reveals -quiet, even and easy respiratory effort with no use of accessory muscles and on auscultation, normal breath sounds, no adventitious sounds and normal vocal resonance. Inspection Chest Wall - Normal. Back - normal.  Cardiovascular Cardiovascular examination reveals -normal heart sounds, regular rate and rhythm with no murmurs and normal pedal pulses bilaterally.  Abdomen Inspection Inspection of  the abdomen reveals - No Hernias. Palpation/Percussion Palpation and Percussion of the abdomen reveal - Soft, Non Tender, No Rebound tenderness, No Rigidity (guarding) and No hepatosplenomegaly. Auscultation Auscultation of the abdomen reveals - Bowel sounds normal.  Neurologic Neurologic evaluation reveals -alert and oriented x 3 with no impairment of recent or remote memory. Mental Status-Normal.  Musculoskeletal Global Assessment -Note: no gross deformities.  Normal Exam - Left-Upper Extremity Strength Normal and Lower Extremity Strength Normal. Normal Exam - Right-Upper Extremity Strength Normal and Lower Extremity Strength Normal.  Lymphatic Head & Neck  General Head & Neck Lymphatics: Bilateral - Description - Normal. Axillary  General Axillary Region: Bilateral - Description - Normal. Tenderness - Non Tender. Femoral & Inguinal  Generalized Femoral & Inguinal Lymphatics: Bilateral - Description - No Generalized lymphadenopathy.    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 The severe pain that he has having and the appearance of invasion of the portal vein with possible collateralization would make it unresectable. That will definitely need to be reimaged if he does not have metastatic disease.  I briefly discussed what surgery would entail. No matter what, he would need  to have neoadjuvant treatment with restaging. He is given information about Port-A-Cath. He is seeing oncology tomorrow. We will review him at multidisciplinary conference this week.  follow-up with me will depend on the findings at conference and the diagnosis of the lung lesions. Current Plans Referred to Nutritional Counseling, for evaluation and follow up (Dietician/Health Nutritionist). Routine. Pt Education - ccs port insertion education SEVERE PROTEIN-CALORIE MALNUTRITION (E43) Impression: He needs to see nutrition at his oncology appointment tomorrow. I discussed strategies of increasing his protein intake. I discussed strategies of eating frequent small meals. I do not think he needs pancreatic enzymes as he is severely constipated and this would exacerbate problems. PAIN, CANCER (G89.3) Impression: the level of narcotics that he is taking seem reasonable for the location of this mass. 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 He is seen in tomorrow as to whether this would be an indication to start anticoagulation. I am suspicious that this may transition to portal vein thrombosis which would be a contraindication for surgery.    Signed by [name], MD (02/22/2017 5:29 PM)

## 2017-03-15 NOTE — Progress Notes (Addendum)
[place]  Telephone:([phone]) [phone] [phone]  Clinic Follow up Note   Patient Care Team: Gildardo Pounds, NP as PCP - General (Nurse Practitioner) Truitt Merle, MD as Consulting Physician (Hematology) [name], MD as Consulting Physician (General Surgery) Alla Feeling, NP as Nurse Practitioner (Nurse Practitioner) 03/15/2017   CHIEF COMPLAINT: F/u metastatic pancreatic cancer and primary lung adenocarcinoma   SUMMARY OF ONCOLOGIC HISTORY: Oncology History   Cancer Staging Malignant neoplasm of body of pancreas [facility]) Staging form: Exocrine Pancreas, AJCC 8th Edition - Clinical stage from 02/18/2017: Stage IV (cT3, cN2, cM1) - Signed by Truitt Merle, MD on 03/08/2017       Malignant neoplasm of body of pancreas [facility])    Initial Diagnosis    Malignant neoplasm of body of pancreas ([place])      02/15/2017 Imaging    CT AP w contrast IMPRESSION: 1. Mass within the body of the pancreas measuring approximately 3.8 cm in greatest diameter. 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 The pancreatic mass causes distal atrophy and ductal dilatation of the tail of the pancreas. There may be metastatic spread to a gastrohepatic lymph node measuring approximately 2 cm. Margination of this potential metastatic lymph node is very difficult due to the lack of intra-abdominal fat and further soft tissue delineation may be possible with MRI of the abdomen. 2. Probable trace ascites in the peritoneal cavity. 3. Small adjacent calcifications along the subpleural/pleural left posterior hemithorax has a benign appearance and represents either 2 adjacent calcified granulomata or partially calcified pleural plaque.       02/15/2017  Imaging    MR abdomen w/wo contrast  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  Bulky peripancreatic adenopathy with extension into the porta hepatis. This causes obstruction of left intrahepatic bile ducts.      02/18/2017 Initial Biopsy    Diagnosis FINE NEEDLE ASPIRATION, ENDOSCOPIC, PANCREAS, BODY (SPECIMEN 1 OF 1 COLLECTED 02/18/17): MALIGNANT CELLS CONSISTENT WITH ADENOCARCINOMA.      02/22/2017 Imaging    CT chest w contrast IMPRESSION: 1. Bilateral subsolid and partially solid nodules in addition to a solid 1.3 by 1.1 cm right upper lobe nodule, appearance suspicious for malignancy. Given the spiculated and subsolid appearance of some of these nodules, I cannot exclude a second primary lung malignancy in addition to the known pancreatic lesion. 2. Known pancreatic mass with stable findings in the upper abdomen. 3. 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 4.  Aortic Atherosclerosis (ICD10-I70.0).        03/05/2017 Imaging    PET Scan IMPRESSION: Hypermetabolic mass in the pancreatic body, consistent with known pancreatic carcinoma. Hypermetabolic abdominal lymphadenopathy, consistent with metastatic disease. Multiple peritoneal metastases throughout the abdomen and pelvis. Minimal ascites. Several bilateral hypermetabolic pulmonary nodules, highly suspicious for pulmonary metastases.       Adenocarcinoma of lung, right ([place])   03/11/2017 Pathology Results    Diagnosis Lung, needle/core biopsy(ies), Right Upper Lobe - ADENOCARCINOMA, CONSISTENT WITH LUNG PRIMARY. - SEE COMMENT. Microscopic Comment The malignant cells are positive for TTF-1 and cytokeratin 7. They are negative for CDX2 and cytokeratin 20. The findings are consistent with primary lung adenocarcinoma. (JBKj  03/15/2016) Dr. Burr Medico was paged on 03/15/2017.  (JBK:ah 03/15/17).      03/11/2017 Initial Biopsy    CT BIOPSY FINDINGS: Mildly spiculated 1.2 cm nodule in the anterior inferior right upper lobe. Needle position was confirmed adjacent to the lesion. Two core biopsies obtained from this lesion. Minimal hemorrhage around the nodule following the core biopsies. Negative for pneumothorax.  IMPRESSION: CT-guided core biopsies of the anterior right upper lobe nodule.       03/15/2017 Initial Diagnosis    Adenocarcinoma of lung, right ([place])     CURRENT THERAPY: pending firs line chemo Gemcitabine and Abraxane on day 1,8, every 21 days; cycle 1 with single agent Gemcitabine only  INTERVAL HISTORY: Mr. Wallen returns for follow up as scheduled. He had right lung CT biopsy 03/11/17 without difficulty. Abdomen is sore from lovenox injections; denies bleeding. Has BM every other day using mag citrate. Mild nausea is now well controlled with alternating zofran and compazine before meals. He has no appetite and has further weight loss from last visit. Drinks 3 ensure per day. Epigastric pain fluctuates to 7/10 then improves to 2/10 with oxycodone, pain relieved for approx 6 hours. He feels the dose is adequate. Spouse notes his eyes became yellow 2 days ago and his urine is dark lately. Denies itching. Spends most of his day resting.   REVIEW OF SYSTEMS:   Constitutional: Denies fevers, chills (+) abnormal weight loss (+) fatigue  Eyes: Denies blurriness of vision (+) yellowing of eyes 2 days ago Ears, nose, mouth, throat, and face: Denies mucositis or sore throat Respiratory: Denies cough, dyspnea or wheezes Cardiovascular: Denies palpitation, chest discomfort or lower extremity swelling Gastrointestinal:  Denies emesis, diarrhea, heartburn or change in bowel habits (+) intermittent nausea, well controlled alternating zofran and compazine before meals (+) constipation, BM QOD with mag citrate  GU: (+) dark urine  Skin: Denies abnormal skin rashes or  itching  Lymphatics: Denies new lymphadenopathy or easy bruising Neurological:Denies numbness, tingling or new weaknesses Behavioral/Psych: Mood is stable, no new changes (+) does not sleep well  All other systems were reviewed with the patient and are negative.  MEDICAL HISTORY:  Past Medical History:  Diagnosis Date  . Adenocarcinoma of lung, right ([place]) 03/15/2017  . Pancreatic cancer [facility])     SURGICAL HISTORY: Past Surgical History:  Procedure Laterality Date  . EUS N/A 02/18/2017   Procedure: UPPER ENDOSCOPIC ULTRASOUND (EUS) LINEAR;  Surgeon: [name], MD;  Location: [facility] ENDOSCOPY;  Service: Endoscopy;  Laterality: N/A;  . FINE NEEDLE ASPIRATION N/A 02/18/2017   Procedure: FINE NEEDLE ASPIRATION (FNA) LINEAR;  Surgeon: [name], MD;  Location: [facility] ENDOSCOPY;  Service: Endoscopy;  Laterality: N/A;  pancreatic mass, needs biopsy.      I have reviewed the social history and family history with the patient and they are unchanged from previous note.  ALLERGIES:  has No Known Allergies.  MEDICATIONS:  Current Outpatient Medications  Medication Sig Dispense Refill  . enoxaparin (LOVENOX) 80 MG/0.8ML injection Inject 0.8 mLs (80 mg total) into the skin daily. 30 Syringe 1  . lidocaine-prilocaine (EMLA) cream Apply to affected area once 30 g 3  . magnesium citrate SOLN Take 1 Bottle by mouth once as needed for moderate constipation.    . ondansetron (ZOFRAN) 8 MG tablet Take 1 tablet (8 mg total) by mouth 2 (two) times daily as needed (Nausea or vomiting). 30 tablet 1  . oxyCODONE (OXY IR/ROXICODONE) 5 MG immediate release tablet Take 1  tablet (5 mg total) by mouth every 4 (four) hours as needed for severe pain. 90 tablet 0  . oxyCODONE-acetaminophen (PERCOCET/ROXICET) 5-325 MG tablet Take 1-2 tablets by mouth every 6 (six) hours as needed for severe pain. 30 tablet 0  . prochlorperazine (COMPAZINE) 10 MG tablet Take 1 tablet (10 mg total) by mouth every 6 (six) hours as  needed (Nausea or vomiting). 30 tablet 1  . senna (SENOKOT) 8.6 MG TABS tablet Take 1 tablet (8.6 mg total) by mouth 2 (two) times daily. 30 each 0  . traZODone (DESYREL) 100 MG tablet Take 1 tablet (100 mg total) by mouth at bedtime. 30 tablet 0  . dronabinol (MARINOL) 2.5 MG capsule Take 1 capsule (2.5 mg total) by mouth 2 (two) times daily before a meal. 60 capsule 0   No current facility-administered medications for this visit.     PHYSICAL EXAMINATION: ECOG PERFORMANCE STATUS: 3 - Symptomatic, >50% confined to bed  Vitals:   03/15/17 1134  BP: 98/69  Pulse: (!) 109  Resp: 20  Temp: 98.4 F (36.9 C)  SpO2: 100%   Filed Weights   03/15/17 1134  Weight: 104 lb 11.2 oz (47.5 kg)    GENERAL:alert, no distress and comfortable SKIN: skin color, texture, turgor are normal, no rashes or significant lesions  EYES: normal, Conjunctiva are pink and non-injected (+) mild scleral icterus OROPHARYNX:no exudate, no erythema and lips, buccal mucosa, and tongue normal  NECK: supple, thyroid normal size, non-tender, without nodularity LYMPH:  no palpable cervical, supraclavicular, axillary lymphadenopathy  LUNGS: clear to auscultation bilaterally with normal breathing effort HEART: regular rate & rhythm and no murmurs and no lower extremity edema ABDOMEN:abdomen soft, non-tender and normal bowel sounds Musculoskeletal:no cyanosis of digits and no clubbing  NEURO: alert & oriented x 3 with fluent speech, no focal motor/sensory deficits  LABORATORY DATA:  I have reviewed the data as listed CBC Latest Ref Rng & Units 03/15/2017 03/11/2017 02/26/2017  WBC 4.0 - 10.5 K/uL - 6.7 8.4  Hemoglobin 13.0 - 17.0 g/dL - 14.0 14.3  Hematocrit 38.4 - 49.9 % 41.6 42.1 43.2  Platelets 150 - 400 K/uL - 426(H) 374     CMP Latest Ref Rng & Units 03/15/2017 02/26/2017 02/16/2017  Glucose 70 - 140 mg/dL 126 114(H) 109(H)  BUN 7 - 26 mg/dL 22 23(H) 12  Creatinine 0.61 - 1.24 mg/dL - 0.84 0.69  Sodium 136 -  145 mmol/L 136 135 133(L)  Potassium 3.5 - 5.1 mmol/L 4.3 4.2 3.8  Chloride 98 - 109 mmol/L 98 95(L) 102  CO2 22 - 29 mmol/L 29 31 25   Calcium 8.4 - 10.4 mg/dL 9.9 9.8 8.8(L)  Total Protein 6.4 - 8.3 g/dL 7.6 7.9 6.3(L)  Total Bilirubin 0.2 - 1.2 mg/dL 7.9(HH) 0.7 0.7  Alkaline Phos 40 - 150 U/L 354(H) 179(H) 116  AST 5 - 34 U/L 92(H) 48(H) 30  ALT 0 - 55 U/L 146(H) 53 34   Diagnosis 02/18/17 FINE NEEDLE ASPIRATION, ENDOSCOPIC, PANCREAS, BODY (SPECIMEN 1 OF 1 COLLECTED 02/18/17): MALIGNANT CELLS CONSISTENT WITH ADENOCARCINOMA.  Diagnosis 03/11/17 Lung, needle/core biopsy(ies), Right Upper Lobe - ADENOCARCINOMA, CONSISTENT WITH LUNG PRIMARY. - SEE COMMENT. Microscopic Comment The malignant cells are positive for TTF-1 and cytokeratin 7. They are negative for CDX2 and cytokeratin 20. The findings are consistent with primary lung adenocarcinoma. (JBKj 03/15/2016)   RADIOGRAPHIC STUDIES: I have personally reviewed the radiological images as listed and agreed with the findings in the report. No results found.  ASSESSMENT & PLAN: 67 y.o. male with no past medical history presented with vague abdominal pain now with adenocarcinoma of the pancreas and multiple lung nodules found on staging work up.  1. Adenocarcinoma of body of pancreas, with peritoneal metastasis, [id number], stage IV  -He is scheduled to attend chemo class today and PAC placement 1/8 per Dr. Barry Dienes; pending chemo to start 1/9.  -He continues to lose weight, 8 lbs in last 8 days. Will start Marinol for appetite stimulation -He will see dietician 1/9 in infusion area -Due to continued weight loss and low performance status, Dr. Burr Medico recommends single agent gemcitabine with cycle 1 on days 1 and 8, may add abraxane in the future if he tolerates, he agrees to proceed; I discussed with pharmacy  -He presents today with clinical juandice, Tbili acutely increased to 7.9, LFTs markedly elevated; suspect biliary obstruction secondary  to bulky peripancreatic adenopathy extending into the porta hepatis -Will have stat US 1/8, likely need to delay chemotherapy   2. Adenocarcinoma of right lung, confirmed by CT biopsy 03/11/17 -We reviewed biopsy results in detail, which shows primary adenocarcinoma of right lung -Dr. Burr Medico recommends SBRT in the future after treatment for pancreatic cancer is underway and he is tolerating. 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 Weight loss, moderate protein and calorie malnutrition -Has continued to lose weight, 8 pounds in 1 week. He does not eat, drinks 3 ensure per day.  -Will add marinol for appetite stimulation.  -He will see dietician 1/9 during chemo infusion  5. Borderline hypotension -BP remains low but stable for him, not on anti-hypertensive medication -HR mildly elevated, likely to mild dehydration.  -Will increase po liquids intake and give 1 L IVF with chemo infusion 1/9 (order placed today)  6. Abdominal pain -Secondary to metastatic pancreatic cancer  -Epigastric pain fluctuates to 7/10 and improves to 2/10 with pain medication; reports pain relief for approx 6 hours -Appears to be well-controlled on current regimen, continue same dose  7. Goals of care discussion -Mr. Gehl and his family understand the treatment goal is palliative in an attempt to prolong his life, improve quality of life, and improve symptoms such as pain, nausea, and constipation -They understand his disease is not curable  8. Scleral icterus, dark urine, hyperbilirubinemia  -MR abdomen revealed no intrahepatic masses but there is intrahepatic biliary duct dilatation seen within the left hepatic lobe which appears to be due to lymphadenopathy in the porta hepatis -He has clinical juandice -Cmet with acutely increased Tbili to 7.9, was  normal 0.72 weeks ago; likely secondary to biliary obstruction -STAT abd [place] on 1/8 at 8 am, I have communicated with Dr. [name] and will discuss report tomorrow for best course of action, anticipating need for ERCP, bile duct stent placement.  PLAN -Stat ABD [place] 1/8, discuss results with Dr. [name], anticipate ERCP, bile duct stent placement -Chemo class today -PAC 1/8 with Dr. Barry Dienes -Marinol for appetite stimulation, dietician f/u in infusion area 1/9 -Cycle 1 chemo with single agent gemcitabine, may need to delay after ERCP   Orders Placed This Encounter  Procedures  . US Abdomen Complete    Standing Status:   Future    Standing Expiration Date:   03/15/2018    Order Specific Question:   Reason for Exam (SYMPTOM  OR DIAGNOSIS REQUIRED)  Answer:   pancreatic cancer with intrahepatic left bile duct dilitation secondary to porta hepatic lymphadenopathy, clinical juandice, elevated bili 7.9    Order Specific Question:   Preferred imaging location?    Answer:   [facility]  . CBC with Differential ([place] Only)    Standing Status:   Standing    Number of Occurrences:   50    Standing Expiration Date:   03/16/2023  . CMP ([place] only)    Standing Status:   Standing    Number of Occurrences:   50    Standing Expiration Date:   03/16/2023  . CA 19.9    Standing Status:   Standing    Number of Occurrences:   50    Standing Expiration Date:   03/16/2023   All questions were answered. The patient knows to call the clinic with any problems, questions or concerns. No barriers to learning was detected.     Alla Feeling, NP 03/15/17   Addendum  I have seen the patient, examined him. I agree with the assessment and and plan and have edited the notes.   We discussed his lung biopsy result, which showed adenocarcinoma, consistent with primary lung cancer. This is likely stage IA disease based on images, however, he does have a few other groundglass lung nodules, possible  multifocal early stage lung cancer, versus metastatic disease.  His peritoneal metastasis is likely from his pancreatic cancer, also metastatic lung cancer is also a possibility.  I will refer him to radiation oncology for evaluation of SBRT of his known lung cancer in the next month, when he is stable. His metastatic pancreatic cancer is certainly more aggressive and advanced, and will require treatment ASAP. He has now developed jaundice, will get an [place] and contact GI for possible biliary stenting. Postpone his chemo to next week.   Truitt Merle  03/15/2017

## 2017-03-15 NOTE — Telephone Encounter (Signed)
  We were notified from Oncology today regarding rising bilirubin for this patient. Recently diagnosed pancreatic cancer, now appears to have associated biliary obstruction. I have discussed his case with Dr. [name] / Dr. Henrene Pastor regarding relief of biliary obstruction. It is felt this patient may be best served by percutaneous biliary drain by IR to drain biliary tree proximally, given prior MRI showed dilation of the left intrahepatic system due to compressive lymphadenopathy. [place] is pending for tomorrow to further evaluate as well.   Almyra Free can you please refer this patient to IR STAT (in the next few days if possible) for biliary drainage. Can you please confirm with him he is not having any fevers. His WBC today was normal, does not sound infected.   Can you let me know timing of his visit with them? Thanks much

## 2017-03-16 ENCOUNTER — Encounter (HOSPITAL_COMMUNITY): Payer: Self-pay | Admitting: Emergency Medicine

## 2017-03-16 ENCOUNTER — Ambulatory Visit (HOSPITAL_COMMUNITY)
Admission: RE | Admit: 2017-03-16 | Discharge: 2017-03-16 | Disposition: A | Discharge status: Home / Self Care | Payer: BC Managed Care – PPO | Source: Ambulatory Visit | Attending: General Surgery | Admitting: General Surgery

## 2017-03-16 ENCOUNTER — Ambulatory Visit (HOSPITAL_COMMUNITY): Payer: BC Managed Care – PPO

## 2017-03-16 ENCOUNTER — Other Ambulatory Visit: Payer: Self-pay

## 2017-03-16 ENCOUNTER — Ambulatory Visit (HOSPITAL_COMMUNITY)
Admission: RE | Admit: 2017-03-16 | Discharge: 2017-03-16 | Disposition: A | Discharge status: Home / Self Care | Payer: BC Managed Care – PPO | Source: Ambulatory Visit | Attending: Nurse Practitioner | Admitting: Nurse Practitioner

## 2017-03-16 ENCOUNTER — Ambulatory Visit (HOSPITAL_COMMUNITY): Payer: BC Managed Care – PPO | Admitting: Anesthesiology

## 2017-03-16 ENCOUNTER — Telehealth: Payer: Self-pay | Admitting: Gastroenterology

## 2017-03-16 ENCOUNTER — Ambulatory Visit: Payer: BC Managed Care – PPO | Admitting: Hematology

## 2017-03-16 ENCOUNTER — Other Ambulatory Visit: Payer: BC Managed Care – PPO

## 2017-03-16 ENCOUNTER — Telehealth: Payer: Self-pay | Admitting: Nurse Practitioner

## 2017-03-16 ENCOUNTER — Encounter (HOSPITAL_COMMUNITY)
Admission: RE | Disposition: A | Discharge status: Home / Self Care | Payer: Self-pay | Source: Ambulatory Visit | Attending: General Surgery

## 2017-03-16 DIAGNOSIS — K5900 Constipation, unspecified: Secondary | ICD-10-CM | POA: Diagnosis not present

## 2017-03-16 DIAGNOSIS — C259 Malignant neoplasm of pancreas, unspecified: Secondary | ICD-10-CM | POA: Diagnosis present

## 2017-03-16 DIAGNOSIS — I700 Atherosclerosis of aorta: Secondary | ICD-10-CM | POA: Insufficient documentation

## 2017-03-16 DIAGNOSIS — F17200 Nicotine dependence, unspecified, uncomplicated: Secondary | ICD-10-CM | POA: Insufficient documentation

## 2017-03-16 DIAGNOSIS — Z79899 Other long term (current) drug therapy: Secondary | ICD-10-CM | POA: Diagnosis not present

## 2017-03-16 DIAGNOSIS — Z452 Encounter for adjustment and management of vascular access device: Secondary | ICD-10-CM

## 2017-03-16 DIAGNOSIS — G893 Neoplasm related pain (acute) (chronic): Secondary | ICD-10-CM | POA: Insufficient documentation

## 2017-03-16 DIAGNOSIS — I82890 Acute embolism and thrombosis of other specified veins: Secondary | ICD-10-CM | POA: Insufficient documentation

## 2017-03-16 DIAGNOSIS — Z811 Family history of alcohol abuse and dependence: Secondary | ICD-10-CM | POA: Diagnosis not present

## 2017-03-16 DIAGNOSIS — Z681 Body mass index (BMI) 19 or less, adult: Secondary | ICD-10-CM | POA: Insufficient documentation

## 2017-03-16 DIAGNOSIS — R17 Unspecified jaundice: Secondary | ICD-10-CM

## 2017-03-16 DIAGNOSIS — E43 Unspecified severe protein-calorie malnutrition: Secondary | ICD-10-CM | POA: Diagnosis not present

## 2017-03-16 DIAGNOSIS — M549 Dorsalgia, unspecified: Secondary | ICD-10-CM | POA: Diagnosis not present

## 2017-03-16 DIAGNOSIS — R945 Abnormal results of liver function studies: Secondary | ICD-10-CM

## 2017-03-16 DIAGNOSIS — C251 Malignant neoplasm of body of pancreas: Secondary | ICD-10-CM | POA: Diagnosis not present

## 2017-03-16 HISTORY — PX: PORTACATH PLACEMENT: SHX2246

## 2017-03-16 LAB — CANCER ANTIGEN 19-9: CAN 19-9: 137 U/mL — AB (ref 0–35)

## 2017-03-16 LAB — GLUCOSE, CAPILLARY: Glucose-Capillary: 103 mg/dL — ABNORMAL HIGH (ref 65–99)

## 2017-03-16 LAB — PROTIME-INR
INR: 0.95
PROTHROMBIN TIME: 12.6 s (ref 11.4–15.2)

## 2017-03-16 SURGERY — INSERTION, TUNNELED CENTRAL VENOUS DEVICE, WITH PORT
Anesthesia: General | Site: Chest | Laterality: Right

## 2017-03-16 MED ORDER — PHENYLEPHRINE 40 MCG/ML (10ML) SYRINGE FOR IV PUSH (FOR BLOOD PRESSURE SUPPORT)
PREFILLED_SYRINGE | INTRAVENOUS | Status: AC
  Filled 2017-03-16: qty 10

## 2017-03-16 MED ORDER — MIDAZOLAM HCL 2 MG/2ML IJ SOLN
INTRAMUSCULAR | Status: AC
  Filled 2017-03-16: qty 2

## 2017-03-16 MED ORDER — FENTANYL CITRATE (PF) 100 MCG/2ML IJ SOLN: 25.0000 ug | INTRAMUSCULAR | Status: DC | PRN

## 2017-03-16 MED ORDER — CHLORHEXIDINE GLUCONATE CLOTH 2 % EX PADS: 6.0000 | MEDICATED_PAD | Freq: Once | CUTANEOUS | Status: DC

## 2017-03-16 MED ORDER — PHENYLEPHRINE HCL 10 MG/ML IJ SOLN
INTRAMUSCULAR | Status: DC | PRN
  Administered 2017-03-16: 80 ug via INTRAVENOUS
  Administered 2017-03-16: 200 ug via INTRAVENOUS
  Administered 2017-03-16: 80 ug via INTRAVENOUS
  Administered 2017-03-16: 200 ug via INTRAVENOUS

## 2017-03-16 MED ORDER — PROPOFOL 10 MG/ML IV BOLUS
INTRAVENOUS | Status: AC
  Filled 2017-03-16: qty 20

## 2017-03-16 MED ORDER — FENTANYL CITRATE (PF) 100 MCG/2ML IJ SOLN
INTRAMUSCULAR | Status: DC | PRN
  Administered 2017-03-16: 25 ug via INTRAVENOUS
  Administered 2017-03-16: 50 ug via INTRAVENOUS
  Administered 2017-03-16: 25 ug via INTRAVENOUS

## 2017-03-16 MED ORDER — 0.9 % SODIUM CHLORIDE (POUR BTL) OPTIME
TOPICAL | Status: DC | PRN
  Administered 2017-03-16: 1000 mL

## 2017-03-16 MED ORDER — BUPIVACAINE-EPINEPHRINE 0.25% -1:200000 IJ SOLN
INTRAMUSCULAR | Status: DC | PRN
  Administered 2017-03-16: 7.5 mL

## 2017-03-16 MED ORDER — BUPIVACAINE-EPINEPHRINE 0.25% -1:200000 IJ SOLN
INTRAMUSCULAR | Status: AC
  Filled 2017-03-16: qty 1

## 2017-03-16 MED ORDER — PHENYLEPHRINE 40 MCG/ML (10ML) SYRINGE FOR IV PUSH (FOR BLOOD PRESSURE SUPPORT)
PREFILLED_SYRINGE | INTRAVENOUS | Status: AC
  Filled 2017-03-16: qty 20

## 2017-03-16 MED ORDER — SUCCINYLCHOLINE CHLORIDE 20 MG/ML IJ SOLN
INTRAMUSCULAR | Status: DC | PRN
  Administered 2017-03-16: 80 mg via INTRAVENOUS

## 2017-03-16 MED ORDER — PHENYLEPHRINE HCL 10 MG/ML IJ SOLN
INTRAMUSCULAR | Status: AC
  Filled 2017-03-16: qty 1

## 2017-03-16 MED ORDER — LIDOCAINE 2% (20 MG/ML) 5 ML SYRINGE
INTRAMUSCULAR | Status: AC
  Filled 2017-03-16: qty 5

## 2017-03-16 MED ORDER — FENTANYL CITRATE (PF) 100 MCG/2ML IJ SOLN
INTRAMUSCULAR | Status: AC
  Filled 2017-03-16: qty 2

## 2017-03-16 MED ORDER — GABAPENTIN 300 MG PO CAPS
300.0000 mg | ORAL_CAPSULE | ORAL | Status: AC
  Administered 2017-03-16: 300 mg via ORAL
  Filled 2017-03-16: qty 1

## 2017-03-16 MED ORDER — LACTATED RINGERS IV SOLN
INTRAVENOUS | Status: DC
  Administered 2017-03-16 (×2): via INTRAVENOUS

## 2017-03-16 MED ORDER — LIDOCAINE HCL 1 % IJ SOLN
INTRAMUSCULAR | Status: DC | PRN
  Administered 2017-03-16: 7.5 mL

## 2017-03-16 MED ORDER — LIDOCAINE HCL (CARDIAC) 10 MG/ML IV SOLN
INTRAVENOUS | Status: DC | PRN
  Administered 2017-03-16: 25 mg via INTRAVENOUS

## 2017-03-16 MED ORDER — SODIUM CHLORIDE 0.9 % IV SOLN
Freq: Once | INTRAVENOUS | Status: AC
  Administered 2017-03-16: 11:00:00
  Filled 2017-03-16: qty 1.2

## 2017-03-16 MED ORDER — PHENYLEPHRINE HCL 10 MG/ML IJ SOLN
INTRAVENOUS | Status: DC | PRN
  Administered 2017-03-16: 60 ug/min via INTRAVENOUS
  Administered 2017-03-16: 50 ug/min via INTRAVENOUS

## 2017-03-16 MED ORDER — DEXAMETHASONE SODIUM PHOSPHATE 10 MG/ML IJ SOLN
INTRAMUSCULAR | Status: DC | PRN
  Administered 2017-03-16: 10 mg via INTRAVENOUS

## 2017-03-16 MED ORDER — ACETAMINOPHEN 500 MG PO TABS
1000.0000 mg | ORAL_TABLET | ORAL | Status: AC
  Administered 2017-03-16: 1000 mg via ORAL
  Filled 2017-03-16: qty 2

## 2017-03-16 MED ORDER — ONDANSETRON HCL 4 MG/2ML IJ SOLN
INTRAMUSCULAR | Status: DC | PRN
  Administered 2017-03-16: 4 mg via INTRAVENOUS

## 2017-03-16 MED ORDER — ONDANSETRON HCL 4 MG/2ML IJ SOLN
INTRAMUSCULAR | Status: AC
  Filled 2017-03-16: qty 2

## 2017-03-16 MED ORDER — HEPARIN SOD (PORK) LOCK FLUSH 100 UNIT/ML IV SOLN
INTRAVENOUS | Status: AC
  Filled 2017-03-16: qty 5

## 2017-03-16 MED ORDER — PROPOFOL 10 MG/ML IV BOLUS
INTRAVENOUS | Status: DC | PRN
  Administered 2017-03-16: 30 mg via INTRAVENOUS
  Administered 2017-03-16: 90 mg via INTRAVENOUS

## 2017-03-16 MED ORDER — LIDOCAINE HCL (CARDIAC) 20 MG/ML IV SOLN
INTRAVENOUS | Status: DC | PRN
  Administered 2017-03-16: 50 mg via INTRAVENOUS
  Administered 2017-03-16: 25 mg via INTRATRACHEAL

## 2017-03-16 MED ORDER — LIDOCAINE HCL (PF) 1 % IJ SOLN
INTRAMUSCULAR | Status: AC
  Filled 2017-03-16: qty 30

## 2017-03-16 MED ORDER — HEPARIN SOD (PORK) LOCK FLUSH 100 UNIT/ML IV SOLN
INTRAVENOUS | Status: DC | PRN
  Administered 2017-03-16: 500 [IU]

## 2017-03-16 MED ORDER — CEFAZOLIN SODIUM-DEXTROSE 2-4 GM/100ML-% IV SOLN
2.0000 g | INTRAVENOUS | Status: AC
  Administered 2017-03-16: 2 g via INTRAVENOUS
  Filled 2017-03-16: qty 100

## 2017-03-16 MED ORDER — FENTANYL CITRATE (PF) 250 MCG/5ML IJ SOLN
INTRAMUSCULAR | Status: AC
  Filled 2017-03-16: qty 5

## 2017-03-16 MED ORDER — DEXAMETHASONE SODIUM PHOSPHATE 10 MG/ML IJ SOLN
INTRAMUSCULAR | Status: AC
  Filled 2017-03-16: qty 1

## 2017-03-16 SURGICAL SUPPLY — 36 items
BAG DECANTER FOR FLEXI CONT (MISCELLANEOUS) ×3 IMPLANT
BLADE HEX COATED 2.75 (ELECTRODE) ×3 IMPLANT
BLADE SURG 15 STRL LF DISP TIS (BLADE) ×1 IMPLANT
BLADE SURG 15 STRL SS (BLADE) ×2
BLADE SURG SZ11 CARB STEEL (BLADE) ×3 IMPLANT
CHLORAPREP W/TINT 26ML (MISCELLANEOUS) ×3 IMPLANT
COVER PROBE U/S 5X48 (MISCELLANEOUS) ×3 IMPLANT
COVER SURGICAL LIGHT HANDLE (MISCELLANEOUS) ×3 IMPLANT
DECANTER SPIKE VIAL GLASS SM (MISCELLANEOUS) ×3 IMPLANT
DERMABOND ADVANCED (GAUZE/BANDAGES/DRESSINGS) ×4
DERMABOND ADVANCED .7 DNX12 (GAUZE/BANDAGES/DRESSINGS) ×2 IMPLANT
DRAPE C-ARM 42X120 X-RAY (DRAPES) ×3 IMPLANT
DRAPE LAPAROTOMY TRNSV 102X78 (DRAPE) ×3 IMPLANT
DRAPE UTILITY XL STRL (DRAPES) ×3 IMPLANT
DRSG TEGADERM 4X4.75 (GAUZE/BANDAGES/DRESSINGS) ×3 IMPLANT
ELECT PENCIL ROCKER SW 15FT (MISCELLANEOUS) ×3 IMPLANT
ELECT REM PT RETURN 15FT ADLT (MISCELLANEOUS) ×3 IMPLANT
GAUZE SPONGE 4X4 16PLY XRAY LF (GAUZE/BANDAGES/DRESSINGS) ×3 IMPLANT
GLOVE BIO SURGEON STRL SZ 6 (GLOVE) ×3 IMPLANT
GLOVE INDICATOR 6.5 STRL GRN (GLOVE) ×3 IMPLANT
GOWN STRL REUS W/TWL 2XL LVL3 (GOWN DISPOSABLE) ×3 IMPLANT
GOWN STRL REUS W/TWL XL LVL3 (GOWN DISPOSABLE) ×3 IMPLANT
KIT BASIN OR (CUSTOM PROCEDURE TRAY) ×3 IMPLANT
KIT PORT POWER 8FR ISP CVUE (Miscellaneous) ×3 IMPLANT
NEEDLE HYPO 22GX1.5 SAFETY (NEEDLE) ×3 IMPLANT
PACK BASIC VI WITH GOWN DISP (CUSTOM PROCEDURE TRAY) ×3 IMPLANT
SUT MNCRL AB 4-0 PS2 18 (SUTURE) ×3 IMPLANT
SUT PROLENE 2 0 SH DA (SUTURE) ×6 IMPLANT
SUT VIC AB 3-0 SH 27 (SUTURE) ×2
SUT VIC AB 3-0 SH 27X BRD (SUTURE) ×1 IMPLANT
SYR 10ML LL (SYRINGE) ×3 IMPLANT
SYR CONTROL 10ML LL (SYRINGE) ×3 IMPLANT
TOWEL OR 17X26 10 PK STRL BLUE (TOWEL DISPOSABLE) ×3 IMPLANT
TOWEL OR NON WOVEN STRL DISP B (DISPOSABLE) ×3 IMPLANT
WIRE COONS/BENSON .038X145CM (WIRE) ×3 IMPLANT
YANKAUER SUCT BULB TIP 10FT TU (MISCELLANEOUS) IMPLANT

## 2017-03-16 NOTE — Telephone Encounter (Signed)
Great, thanks very much for your help in coordinating this so quickly

## 2017-03-16 NOTE — Progress Notes (Signed)
Wife aware of schedule for MRCP tomorrow at [facility] and IR to follow on 1/10.  Wife unsure to have chemo in AM?  RN instructed wife to call oncology RN and Dr Porfirio Oar office for clarification.

## 2017-03-16 NOTE — Telephone Encounter (Signed)
Patient having MRCP and IR percutaneous drain placement 1/9 and 1/10 respectively. Chemotherapy on hold. I informed patient's spouse that 1/9 lab and chemo is cancelled, I reiterated instructions given by IR RN and time/date for upcoming studies. She verbalized understanding. Next f/u 1/16 for lab, f/u, and possible chemo.

## 2017-03-16 NOTE — Telephone Encounter (Signed)
Patient is scheduled for MRCP at [facility] on 1/9 arrive at 6:45 pm for 7:00 pm test, NPO 4 hours prior. Patient is scheduled for IR drain placement at [facility] on 1/10 arrive at 7:30 am, NPO 6 hours prior. Patient is on lovenox, per Anderson Malta in IR protocol is to hold one dose prior, patient usually takes it in the morning, Patient's wife verbalized understanding to hold that morning dose of lovenox. Patient may take any regular meds with a small sip of water by 6:00 am. Patient does not take any diabetic medications per wife and med record. Patient's wife given ALL instructions after discharge from hospital today, also spoke to Maudie Mercury, nurse in PACU and given instructions about MRCP, these should be printed out on discharge instructions.

## 2017-03-16 NOTE — Interval H&P Note (Signed)
History and Physical Interval Note:  03/16/2017 10:35 AM  Fred Bradford  has presented today for surgery, with the diagnosis of pancreatic cancer  The various methods of treatment have been discussed with the patient and family. After consideration of risks, benefits and other options for treatment, the patient has consented to  Procedure(s): INSERTION PORT-A-CATH (N/A) as a surgical intervention .  The patient's history has been reviewed, patient examined, no change in status, stable for surgery.  I have reviewed the patient's chart and labs.  Questions were answered to the patient's satisfaction.     [name]

## 2017-03-16 NOTE — Discharge Instructions (Addendum)
[place] Office Phone Number [phone]   POST OP INSTRUCTIONS  Always review your discharge instruction sheet given to you by the facility where your surgery was performed.  IF YOU HAVE DISABILITY OR FAMILY LEAVE FORMS, YOU MUST BRING THEM TO THE OFFICE FOR PROCESSING.  DO NOT GIVE THEM TO YOUR DOCTOR.  1. A prescription for pain medication may be given to you upon discharge.  Take your pain medication as prescribed, if needed.  If narcotic pain medicine is not needed, then you may take acetaminophen (Tylenol) or ibuprofen (Advil) as needed. 2. Take your usually prescribed medications unless otherwise directed 3. If you need a refill on your pain medication, please contact your pharmacy.  They will contact our office to request authorization.  Prescriptions will not be filled after 5pm or on week-ends. 4. You should eat very light the first 24 hours after surgery, such as soup, crackers, pudding, etc.  Resume your normal diet the day after surgery 5. It is common to experience some constipation if taking pain medication after surgery.  Increasing fluid intake and taking a stool softener will usually help or prevent this problem from occurring.  A mild laxative (Milk of Magnesia or Miralax) should be taken according to package directions if there are no bowel movements after 48 hours. 6. You may shower in 48 hours.  The surgical glue will flake off in 2-3 weeks.   7. ACTIVITIES:  No strenuous activity or heavy lifting for 1 week.   a. You may drive when you no longer are taking prescription pain medication, you can comfortably wear a seatbelt, and you can safely maneuver your car and apply brakes. b. RETURN TO WORK:  __________to be determined._______________ Dennis Bast should see your doctor in the office for a follow-up appointment approximately three-four weeks after your surgery.    WHEN TO CALL YOUR DOCTOR: 1. Fever over 101.0 2. Nausea and/or vomiting. 3. Extreme swelling  or bruising. 4. Continued bleeding from incision. 5. Increased pain, redness, or drainage from the incision.  The clinic staff is available to answer your questions during regular business hours.  Please dont hesitate to call and ask to speak to one of the nurses for clinical concerns.  If you have a medical emergency, go to the nearest emergency room or call 911.  A surgeon from [facility] is always on call at the hospital.  For further questions, please visit [email or web address]   Patient has a MRCP scheduled for tomorrow at 7 pm.   He needs to arrive to admitting at 6:45 pm tomorrow at [facility].  Please have the patient have nothing to eat and drink 4 hours prior to the procedure.  Please do not eat or drink anything after 2 pm tomorrow.  Almyra Free, Dr. Doyne Keel nurse gave these insructions to the patient and the same information will be given to Mrs. Carter Kitten at discharge by Zerita Boers, RN.    Patient to have procedure with IR on 03/18/2017.  IR is to call the patient to schedule this procedure.

## 2017-03-16 NOTE — Anesthesia Postprocedure Evaluation (Signed)
Anesthesia Post Note  Patient: Fred Bradford  Procedure(s) Performed: INSERTION PORT-A-CATH (Right Chest)     Anesthesia Post Evaluation  Last Vitals:  Vitals:   03/16/17 1330 03/16/17 1345  BP: 101/73 104/70  Pulse:  95  Resp:  17  Temp: 37.1 C 37.4 C  SpO2:  96%    Last Pain:  Vitals:   03/16/17 1300  TempSrc:   PainSc: Asleep                 Barnet Glasgow, M.D.

## 2017-03-16 NOTE — Telephone Encounter (Signed)
I reviewed US findings and spoke with my colleagues who perform ERCP, and Dr. Vernard Gambles of IR. Concerned for more proximal biliary obstruction which would be better served by IR guided drainage. Dr. Vernard Gambles feels this is appropriate but MRCP but would useful prior to proceeding to better assess where drain should be placed. Will plan on coordinating MRCP to be done in the next 24-48 hours with drain placement per IR the following day. The patient had a normal WBC, does not appear infected. If worsens in the interim or fevers, needs to go to the hospital otherwise to have this done sooner.   Almyra Free can you let me know when MRCP is scheduled and his drain placement per IR as we discussed. If he is being admitted following his procedure today let me know and will coordinate MRCP while he is in the hospital. Thanks

## 2017-03-16 NOTE — Transfer of Care (Signed)
Immediate Anesthesia Transfer of Care Note  Patient: Fred Bradford  Procedure(s) Performed: INSERTION PORT-A-CATH (Right Chest)  Patient Location: PACU  Anesthesia Type:General  Level of Consciousness: awake  Airway & Oxygen Therapy: Patient Spontanous Breathing and Patient connected to face mask oxygen  Post-op Assessment: Report given to RN and Post -op Vital signs reviewed and stable  Post vital signs: Reviewed and stable  Last Vitals:  Vitals:   03/16/17 0745 03/16/17 1243  BP: 120/86   Pulse: (!) 115 88  Resp: 16 17  Temp: 37.1 C   SpO2: 98% 100%    Last Pain:  Vitals:   03/16/17 0829  TempSrc:   PainSc: 4       Patients Stated Pain Goal: 4 (90/90/30 1499)  Complications: No apparent anesthesia complications

## 2017-03-16 NOTE — Telephone Encounter (Signed)
Spoke to IR at [facility], to have a STAT procedure done would need to speak to radiologist. Gave me Dr. Adron Bene pager number, Dr. Havery Moros will contact him to discuss timing of getting this done. Patient is currently admitted to [facility] for port placement.

## 2017-03-16 NOTE — Telephone Encounter (Signed)
Unable to schedule 1/7 los due to capped day - to call patient when appt is scheduled. - logged.

## 2017-03-16 NOTE — Anesthesia Procedure Notes (Signed)
Procedure Name: Intubation Date/Time: 03/16/2017 10:45 AM Performed by: Lissa Morales, CRNA Pre-anesthesia Checklist: Patient identified, Emergency Drugs available, Suction available and Patient being monitored Patient Re-evaluated:Patient Re-evaluated prior to induction Oxygen Delivery Method: Circle system utilized Preoxygenation: Pre-oxygenation with 100% oxygen Induction Type: IV induction Ventilation: Mask ventilation without difficulty Laryngoscope Size: Mac and 4 Grade View: Grade II Tube type: Oral Tube size: 7.5 mm Number of attempts: 1 Airway Equipment and Method: Stylet and Oral airway Placement Confirmation: ETT inserted through vocal cords under direct vision,  positive ETCO2 and breath sounds checked- equal and bilateral Secured at: 22 cm Tube secured with: Tape Dental Injury: Teeth and Oropharynx as per pre-operative assessment  Difficulty Due To: Difficult Airway- due to anterior larynx

## 2017-03-16 NOTE — Op Note (Signed)
PREOPERATIVE DIAGNOSIS:  Pancreatic cancer     POSTOPERATIVE DIAGNOSIS:  Same     PROCEDURE: Right internal jugular port placement, Bard ClearVue  Power Port, MRI safe, 8-French.      SURGEON:  [name], MD      ANESTHESIA:  General   FINDINGS:  Good venous return, easy flush, and tip of the catheter and   SVC 21.5 cm.      SPECIMEN:  None.      ESTIMATED BLOOD LOSS:  Minimal.      COMPLICATIONS:  None known.      PROCEDURE:  Pt was identified in the holding area and taken to   the operating room, where patient was placed supine on the operating room   table.  General anesthesia was induced.  Patient's arms were tucked and the upper   chest and neck were prepped and draped in sterile fashion.  Time-out was   performed according to the surgical safety check list.  When all was   correct, we continued.   Local anesthetic was administered over this   area at the angle of the clavicle.    The vein was unable to be accessed despite multiple sticks at different angles.  The left IJ was evaluated with ultrasound.  The IJ was cannulated successfully multiple times, but the wire would not pass beyond around 3 cm past the tip of the needle.  The left side was abandoned and I switched to the right side.    The right IJ vein was not easily seen with the ultrasound.  The right subclavian vein was accessed easily, but the wire would only thread to the neck.  The Right IJ was then reattempted, and the vein was accessed.  The wire then threaded easily and confirmed with fluoroscopy.       The patient was placed back level and the area for the pocket was anethetized   with local anesthetic.  A 3-cm transverse incision was made with a #15   blade.  Cautery was used to divide the subcutaneous tissues down to the   pectoralis muscle.  An [employer] retractor was used to elevate the skin   while a pocket was created on top of the pectoralis fascia.  The port   was placed into the pocket to confirm  that it was of adequate size.  The   catheter was preattached to the port.  The port was then secured to the   pectoralis fascia with four 2-0 Prolene sutures.  These were clamped and   not tied down yet.    The catheter was tunneled through to the wire exit   site.  The catheter was placed along the wire to determine what length it should be to be in the SVC.  The catheter was cut at 21.5 cm.  The tunneler sheath and dilator were passed over the wire and the dilator and wire were removed.  The catheter was advanced through the tunneler sheath and the tunneler sheath was pulled away.  Care was taken to keep the catheter in the tunneler sheath as this occurred. This was advanced and the tunneler sheath was removed.  There was good venous   return and easy flush of the catheter.  The Prolene sutures were tied   down to the pectoral fascia.  The skin was reapproximated using 3-0   Vicryl interrupted deep dermal sutures.    Fluoroscopy was used to re-confirm good position of the catheter.  The skin  was then closed using 4-0 Monocryl in a subcuticular fashion.  The port was flushed with concentrated heparin flush as well.  The wounds were then cleaned, dried, and dressed with Dermabond.  The port was left accessed and the access tubing was dressed with gauze and tegaderm.  The patient was awakened from anesthesia and taken to the PACU in stable condition.  Needle, sponge, and instrument counts were correct.               [name], MD

## 2017-03-16 NOTE — Anesthesia Postprocedure Evaluation (Signed)
Anesthesia Post Note  Patient: Fred Bradford  Procedure(s) Performed: INSERTION PORT-A-CATH (Right Chest)     Patient location during evaluation: PACU Anesthesia Type: General Level of consciousness: awake and alert Pain management: pain level controlled Vital Signs Assessment: post-procedure vital signs reviewed and stable Respiratory status: spontaneous breathing, nonlabored ventilation, respiratory function stable and patient connected to nasal cannula oxygen Cardiovascular status: blood pressure returned to baseline and stable Postop Assessment: no apparent nausea or vomiting Anesthetic complications: no    Last Vitals:  Vitals:   03/16/17 1330 03/16/17 1345  BP: 101/73   Pulse:  95  Resp:  17  Temp: 37.1 C 37.4 C  SpO2:  96%    Last Pain:  Vitals:   03/16/17 1300  TempSrc:   PainSc: Bramwell A Houser

## 2017-03-16 NOTE — Anesthesia Preprocedure Evaluation (Addendum)
Anesthesia Evaluation  Patient identified by MRN, date of birth, ID band Patient awake    Reviewed: Allergy & Precautions, NPO status , Patient's Chart, lab work & pertinent test results  History of Anesthesia Complications Negative for: history of anesthetic complications  Airway Mallampati: I  TM Distance: >3 FB Neck ROM: Full    Dental  (+) Poor Dentition, Loose, Missing, Dental Advisory Given   Pulmonary former smoker,    breath sounds clear to auscultation       Cardiovascular negative cardio ROS   Rhythm:Regular Rate:Normal     Neuro/Psych negative neurological ROS     GI/Hepatic Neg liver ROS, Abdominal pain, pancreatic mass   Endo/Other  Pancreatic CA  Renal/GU negative Renal ROS     Musculoskeletal   Abdominal   Peds  Hematology negative hematology ROS (+)   Anesthesia Other Findings   Reproductive/Obstetrics                            Lab Results  Component Value Date   WBC 6.7 03/11/2017   HGB 14.0 03/11/2017   HCT 41.6 03/15/2017   MCV 85.8 03/15/2017   PLT 426 (H) 03/11/2017   Lab Results  Component Value Date   CREATININE 0.84 02/26/2017   BUN 22 03/15/2017   NA 136 03/15/2017   K 4.3 03/15/2017   CL 98 03/15/2017   CO2 29 03/15/2017    Anesthesia Physical  Anesthesia Plan  ASA: III  Anesthesia Plan: General   Post-op Pain Management:    Induction: Intravenous and Rapid sequence  PONV Risk Score and Plan: 2 and Ondansetron, Treatment may vary due to age or medical condition and Dexamethasone  Airway Management Planned: Oral ETT  Additional Equipment:   Intra-op Plan:   Post-operative Plan: Extubation in OR  Informed Consent: I have reviewed the patients History and Physical, chart, labs and discussed the procedure including the risks, benefits and alternatives for the proposed anesthesia with the patient or authorized representative who has  indicated his/her understanding and acceptance.   Dental advisory given  Plan Discussed with: CRNA  Anesthesia Plan Comments:        Anesthesia Quick Evaluation

## 2017-03-17 ENCOUNTER — Encounter (HOSPITAL_COMMUNITY): Payer: Self-pay | Admitting: General Surgery

## 2017-03-17 ENCOUNTER — Ambulatory Visit: Payer: BC Managed Care – PPO

## 2017-03-17 ENCOUNTER — Telehealth: Payer: Self-pay | Admitting: Hematology

## 2017-03-17 ENCOUNTER — Encounter: Payer: BC Managed Care – PPO | Admitting: Nutrition

## 2017-03-17 ENCOUNTER — Other Ambulatory Visit: Payer: BC Managed Care – PPO

## 2017-03-17 ENCOUNTER — Other Ambulatory Visit: Payer: Self-pay | Admitting: Student

## 2017-03-17 ENCOUNTER — Other Ambulatory Visit: Payer: Self-pay | Admitting: Radiology

## 2017-03-17 ENCOUNTER — Ambulatory Visit (HOSPITAL_COMMUNITY)
Admit: 2017-03-17 | Discharge: 2017-03-17 | Disposition: A | Discharge status: Home / Self Care | Payer: BC Managed Care – PPO | Attending: Gastroenterology | Admitting: Gastroenterology

## 2017-03-17 ENCOUNTER — Telehealth: Payer: Self-pay | Admitting: General Surgery

## 2017-03-17 ENCOUNTER — Other Ambulatory Visit: Payer: Self-pay | Admitting: Gastroenterology

## 2017-03-17 DIAGNOSIS — R945 Abnormal results of liver function studies: Secondary | ICD-10-CM

## 2017-03-17 MED ORDER — HEPARIN SOD (PORK) LOCK FLUSH 100 UNIT/ML IV SOLN: 500.0000 [IU] | Freq: Once | INTRAVENOUS | Status: DC

## 2017-03-17 MED ORDER — HEPARIN SOD (PORK) LOCK FLUSH 100 UNIT/ML IV SOLN
INTRAVENOUS | Status: AC
  Filled 2017-03-17: qty 5

## 2017-03-17 MED ORDER — GADOBENATE DIMEGLUMINE 529 MG/ML IV SOLN
10.0000 mL | Freq: Once | INTRAVENOUS | Status: AC | PRN
  Administered 2017-03-17: 9 mL via INTRAVENOUS

## 2017-03-17 NOTE — Telephone Encounter (Signed)
Scheduled appt per 1/7 los - Patient is aware of appt date and time.

## 2017-03-17 NOTE — Progress Notes (Signed)
Spoke to patient and his wife on the phone to discuss his PTC drain placement that is scheduled ASAP for 1/10.  After PTC drain placements, many of these people require at least 24 hr observation admission.  I have discussed this case with Dr. Laurence Ferrari who is scheduled at [facility] tomorrow.  He has asked me to call the patient and make them aware that he may likely require admission after his procedure tomorrow.  His wife and the patient were made aware of this.  The wife was very much in favor of this plan.  If this is the case, we will need to contact the hospitalist tomorrow to inform them to assist with admission.  Patient is aware he is to be here at 0730am for his 0930am procedure start time.  The wife had no further questions at this time.  [name] 3:18 PM 03/17/2017

## 2017-03-18 ENCOUNTER — Encounter (HOSPITAL_COMMUNITY): Payer: Self-pay | Admitting: General Surgery

## 2017-03-18 ENCOUNTER — Other Ambulatory Visit: Payer: Self-pay | Admitting: Gastroenterology

## 2017-03-18 ENCOUNTER — Inpatient Hospital Stay (HOSPITAL_COMMUNITY)
Admission: AD | Admit: 2017-03-18 | Discharge: 2017-03-20 | DRG: 374 | Disposition: A | Discharge status: Hospice | Payer: BC Managed Care – PPO | Source: Ambulatory Visit | Attending: Internal Medicine | Admitting: Internal Medicine

## 2017-03-18 ENCOUNTER — Other Ambulatory Visit: Payer: Self-pay

## 2017-03-18 DIAGNOSIS — Z800 Family history of malignant neoplasm of digestive organs: Secondary | ICD-10-CM | POA: Diagnosis not present

## 2017-03-18 DIAGNOSIS — C3411 Malignant neoplasm of upper lobe, right bronchus or lung: Secondary | ICD-10-CM | POA: Diagnosis present

## 2017-03-18 DIAGNOSIS — C251 Malignant neoplasm of body of pancreas: Secondary | ICD-10-CM | POA: Diagnosis present

## 2017-03-18 DIAGNOSIS — C259 Malignant neoplasm of pancreas, unspecified: Secondary | ICD-10-CM

## 2017-03-18 DIAGNOSIS — K8681 Exocrine pancreatic insufficiency: Secondary | ICD-10-CM | POA: Diagnosis present

## 2017-03-18 DIAGNOSIS — Z515 Encounter for palliative care: Secondary | ICD-10-CM | POA: Diagnosis present

## 2017-03-18 DIAGNOSIS — I82890 Acute embolism and thrombosis of other specified veins: Secondary | ICD-10-CM | POA: Diagnosis present

## 2017-03-18 DIAGNOSIS — E43 Unspecified severe protein-calorie malnutrition: Secondary | ICD-10-CM | POA: Diagnosis present

## 2017-03-18 DIAGNOSIS — C786 Secondary malignant neoplasm of retroperitoneum and peritoneum: Principal | ICD-10-CM | POA: Diagnosis present

## 2017-03-18 DIAGNOSIS — K56691 Other complete intestinal obstruction: Secondary | ICD-10-CM | POA: Diagnosis not present

## 2017-03-18 DIAGNOSIS — K639 Disease of intestine, unspecified: Secondary | ICD-10-CM

## 2017-03-18 DIAGNOSIS — C785 Secondary malignant neoplasm of large intestine and rectum: Secondary | ICD-10-CM | POA: Diagnosis present

## 2017-03-18 DIAGNOSIS — K56609 Unspecified intestinal obstruction, unspecified as to partial versus complete obstruction: Secondary | ICD-10-CM | POA: Diagnosis not present

## 2017-03-18 DIAGNOSIS — R17 Unspecified jaundice: Secondary | ICD-10-CM

## 2017-03-18 DIAGNOSIS — Z7189 Other specified counseling: Secondary | ICD-10-CM

## 2017-03-18 DIAGNOSIS — C772 Secondary and unspecified malignant neoplasm of intra-abdominal lymph nodes: Secondary | ICD-10-CM | POA: Diagnosis present

## 2017-03-18 DIAGNOSIS — R634 Abnormal weight loss: Secondary | ICD-10-CM | POA: Diagnosis not present

## 2017-03-18 DIAGNOSIS — Z681 Body mass index (BMI) 19 or less, adult: Secondary | ICD-10-CM | POA: Diagnosis not present

## 2017-03-18 DIAGNOSIS — C7800 Secondary malignant neoplasm of unspecified lung: Secondary | ICD-10-CM | POA: Diagnosis not present

## 2017-03-18 DIAGNOSIS — Z8042 Family history of malignant neoplasm of prostate: Secondary | ICD-10-CM

## 2017-03-18 DIAGNOSIS — R112 Nausea with vomiting, unspecified: Secondary | ICD-10-CM | POA: Diagnosis not present

## 2017-03-18 DIAGNOSIS — R935 Abnormal findings on diagnostic imaging of other abdominal regions, including retroperitoneum: Secondary | ICD-10-CM | POA: Diagnosis not present

## 2017-03-18 DIAGNOSIS — Z66 Do not resuscitate: Secondary | ICD-10-CM | POA: Diagnosis present

## 2017-03-18 DIAGNOSIS — Z87891 Personal history of nicotine dependence: Secondary | ICD-10-CM | POA: Diagnosis not present

## 2017-03-18 DIAGNOSIS — R630 Anorexia: Secondary | ICD-10-CM | POA: Diagnosis not present

## 2017-03-18 DIAGNOSIS — K831 Obstruction of bile duct: Secondary | ICD-10-CM | POA: Diagnosis present

## 2017-03-18 DIAGNOSIS — C3491 Malignant neoplasm of unspecified part of right bronchus or lung: Secondary | ICD-10-CM | POA: Diagnosis present

## 2017-03-18 HISTORY — DX: Other nonspecific abnormal finding of lung field: R91.8

## 2017-03-18 HISTORY — DX: Disease of intestine, unspecified: K63.9

## 2017-03-18 HISTORY — PX: IR INT EXT BILIARY DRAIN WITH CHOLANGIOGRAM: IMG6044

## 2017-03-18 HISTORY — DX: Unspecified jaundice: R17

## 2017-03-18 LAB — COMPREHENSIVE METABOLIC PANEL
ALK PHOS: 296 U/L — AB (ref 38–126)
ALT: 129 U/L — AB (ref 17–63)
AST: 141 U/L — AB (ref 15–41)
Albumin: 3.1 g/dL — ABNORMAL LOW (ref 3.5–5.0)
Anion gap: 7 (ref 5–15)
BUN: 21 mg/dL — AB (ref 6–20)
CALCIUM: 9 mg/dL (ref 8.9–10.3)
CHLORIDE: 97 mmol/L — AB (ref 101–111)
CO2: 34 mmol/L — ABNORMAL HIGH (ref 22–32)
CREATININE: 0.65 mg/dL (ref 0.61–1.24)
Glucose, Bld: 133 mg/dL — ABNORMAL HIGH (ref 65–99)
Potassium: 3.7 mmol/L (ref 3.5–5.1)
Sodium: 138 mmol/L (ref 135–145)
Total Bilirubin: 7.5 mg/dL — ABNORMAL HIGH (ref 0.3–1.2)
Total Protein: 6.5 g/dL (ref 6.5–8.1)

## 2017-03-18 LAB — CBC
HCT: 35.6 % — ABNORMAL LOW (ref 39.0–52.0)
HEMOGLOBIN: 11.3 g/dL — AB (ref 13.0–17.0)
MCH: 27.4 pg (ref 26.0–34.0)
MCHC: 31.7 g/dL (ref 30.0–36.0)
MCV: 86.4 fL (ref 78.0–100.0)
PLATELETS: 372 10*3/uL (ref 150–400)
RBC: 4.12 MIL/uL — AB (ref 4.22–5.81)
RDW: 14.8 % (ref 11.5–15.5)
WBC: 9.3 10*3/uL (ref 4.0–10.5)

## 2017-03-18 LAB — APTT: APTT: 27 s (ref 24–36)

## 2017-03-18 LAB — PROTIME-INR
INR: 0.96
PROTHROMBIN TIME: 12.7 s (ref 11.4–15.2)

## 2017-03-18 MED ORDER — KCL IN DEXTROSE-NACL 20-5-0.9 MEQ/L-%-% IV SOLN
INTRAVENOUS | Status: DC
  Administered 2017-03-18 – 2017-03-19 (×3): via INTRAVENOUS
  Filled 2017-03-18 (×5): qty 1000

## 2017-03-18 MED ORDER — HEPARIN SODIUM (PORCINE) 5000 UNIT/ML IJ SOLN
5000.0000 [IU] | Freq: Three times a day (TID) | INTRAMUSCULAR | Status: DC
  Administered 2017-03-18 – 2017-03-20 (×6): 5000 [IU] via SUBCUTANEOUS
  Filled 2017-03-18 (×5): qty 1

## 2017-03-18 MED ORDER — FENTANYL CITRATE (PF) 100 MCG/2ML IJ SOLN
INTRAMUSCULAR | Status: AC | PRN
  Administered 2017-03-18 (×2): 50 ug via INTRAVENOUS

## 2017-03-18 MED ORDER — PROCHLORPERAZINE MALEATE 10 MG PO TABS
10.0000 mg | ORAL_TABLET | Freq: Four times a day (QID) | ORAL | Status: DC | PRN
  Filled 2017-03-18: qty 1

## 2017-03-18 MED ORDER — LIDOCAINE-PRILOCAINE 2.5-2.5 % EX CREA
TOPICAL_CREAM | Freq: Two times a day (BID) | CUTANEOUS | Status: DC
  Filled 2017-03-18: qty 5

## 2017-03-18 MED ORDER — SODIUM CHLORIDE 0.9% FLUSH
5.0000 mL | Freq: Three times a day (TID) | INTRAVENOUS | Status: DC
  Administered 2017-03-18 – 2017-03-20 (×4): 5 mL via INTRAVENOUS

## 2017-03-18 MED ORDER — ONDANSETRON HCL 4 MG/2ML IJ SOLN: 4.0000 mg | Freq: Four times a day (QID) | INTRAMUSCULAR | Status: DC | PRN

## 2017-03-18 MED ORDER — IOPAMIDOL (ISOVUE-300) INJECTION 61%
INTRAVENOUS | Status: AC
  Administered 2017-03-18: 20 mL
  Filled 2017-03-18: qty 50

## 2017-03-18 MED ORDER — SENNA 8.6 MG PO TABS
1.0000 | ORAL_TABLET | Freq: Two times a day (BID) | ORAL | Status: DC
  Administered 2017-03-18 – 2017-03-20 (×3): 8.6 mg via ORAL
  Filled 2017-03-18 (×4): qty 1

## 2017-03-18 MED ORDER — SIMETHICONE 80 MG PO CHEW: 80.0000 mg | CHEWABLE_TABLET | Freq: Four times a day (QID) | ORAL | Status: DC | PRN

## 2017-03-18 MED ORDER — ONDANSETRON HCL 4 MG PO TABS: 8.0000 mg | ORAL_TABLET | Freq: Two times a day (BID) | ORAL | Status: DC | PRN

## 2017-03-18 MED ORDER — MIDAZOLAM HCL 2 MG/2ML IJ SOLN
INTRAMUSCULAR | Status: AC | PRN
  Administered 2017-03-18: 1 mg via INTRAVENOUS

## 2017-03-18 MED ORDER — LIDOCAINE HCL (PF) 1 % IJ SOLN
INTRAMUSCULAR | Status: AC | PRN
  Administered 2017-03-18: 10 mL

## 2017-03-18 MED ORDER — SODIUM CHLORIDE 0.9% FLUSH
3.0000 mL | Freq: Two times a day (BID) | INTRAVENOUS | Status: DC
  Administered 2017-03-19: 3 mL via INTRAVENOUS

## 2017-03-18 MED ORDER — HYDROCODONE-ACETAMINOPHEN 5-325 MG PO TABS: 1.0000 | ORAL_TABLET | ORAL | Status: DC | PRN

## 2017-03-18 MED ORDER — SODIUM CHLORIDE 0.9 % IV SOLN: 250.0000 mL | INTRAVENOUS | Status: DC | PRN

## 2017-03-18 MED ORDER — MIDAZOLAM HCL 2 MG/2ML IJ SOLN
INTRAMUSCULAR | Status: AC
  Filled 2017-03-18: qty 2

## 2017-03-18 MED ORDER — KETOROLAC TROMETHAMINE 15 MG/ML IJ SOLN: 15.0000 mg | Freq: Four times a day (QID) | INTRAMUSCULAR | Status: DC | PRN

## 2017-03-18 MED ORDER — OXYCODONE-ACETAMINOPHEN 5-325 MG PO TABS
1.0000 | ORAL_TABLET | Freq: Four times a day (QID) | ORAL | Status: DC | PRN
Start: 2017-03-18 — End: 2017-03-19
  Administered 2017-03-19: 1 via ORAL
  Filled 2017-03-18: qty 1

## 2017-03-18 MED ORDER — ENOXAPARIN SODIUM 80 MG/0.8ML ~~LOC~~ SOLN: 80.0000 mg | SUBCUTANEOUS | Status: DC

## 2017-03-18 MED ORDER — ONDANSETRON HCL 4 MG PO TABS: 4.0000 mg | ORAL_TABLET | Freq: Four times a day (QID) | ORAL | Status: DC | PRN

## 2017-03-18 MED ORDER — SODIUM CHLORIDE 0.9% FLUSH: 3.0000 mL | INTRAVENOUS | Status: DC | PRN

## 2017-03-18 MED ORDER — MAGNESIUM CITRATE PO SOLN: 1.0000 | Freq: Once | ORAL | Status: DC | PRN

## 2017-03-18 MED ORDER — FENTANYL CITRATE (PF) 100 MCG/2ML IJ SOLN
INTRAMUSCULAR | Status: AC
  Filled 2017-03-18: qty 2

## 2017-03-18 MED ORDER — PIPERACILLIN-TAZOBACTAM 3.375 G IVPB
3.3750 g | INTRAVENOUS | Status: AC
  Administered 2017-03-18: 3.375 g via INTRAVENOUS
  Filled 2017-03-18: qty 50

## 2017-03-18 MED ORDER — LIDOCAINE HCL 1 % IJ SOLN
INTRAMUSCULAR | Status: AC
  Filled 2017-03-18: qty 20

## 2017-03-18 MED ORDER — OXYCODONE HCL 5 MG PO TABS: 5.0000 mg | ORAL_TABLET | ORAL | Status: DC | PRN

## 2017-03-18 MED ORDER — DRONABINOL 2.5 MG PO CAPS
2.5000 mg | ORAL_CAPSULE | Freq: Two times a day (BID) | ORAL | Status: DC
  Administered 2017-03-18 – 2017-03-20 (×4): 2.5 mg via ORAL
  Filled 2017-03-18 (×4): qty 1

## 2017-03-18 MED ORDER — CALCIUM CARBONATE ANTACID 500 MG PO CHEW: 1.0000 | CHEWABLE_TABLET | Freq: Every day | ORAL | Status: DC | PRN

## 2017-03-18 MED ORDER — SODIUM CHLORIDE 0.9 % IV SOLN: INTRAVENOUS | Status: DC

## 2017-03-18 MED ORDER — TRAZODONE HCL 100 MG PO TABS
100.0000 mg | ORAL_TABLET | Freq: Every day | ORAL | Status: DC
  Administered 2017-03-18 – 2017-03-19 (×2): 100 mg via ORAL
  Filled 2017-03-18 (×2): qty 1

## 2017-03-18 NOTE — Sedation Documentation (Signed)
Patient is resting comfortably. 

## 2017-03-18 NOTE — H&P (Signed)
History and Physical    Fred Bradford UXN:[id number] DOB: Aug 22, 1950 DOA: 03/18/2017  PCP: Gildardo Pounds, NP  Patient coming from: Home via interventional radiology  I have personally briefly reviewed patient's old medical records in [place]  Chief Complaint: Jaundice and abdominal pain  HPI: Fred Bradford is a 67 y.o. male with primary pancreatic cancer and primary lung cancer who now presents with a bowel obstruction.  He has recently diagnosed pancreatic cancer(02/15/2017) with concern for invasion of the splenic artery and vein.  It was confirmed with a biopsy during ERCP.  He was referred to Dr. Barry Dienes who felt like this was likely to be unresectable pending further imaging.  He ultimately has had a PAC placed on 03-15-17 by Dr. Barry Dienes to initiate treatment.  He was referred to oncology as well and in further imaging found a lung lesion.  The patient came in on 03-11-17 to have a lung biopsy to confirm a met vs another primary.  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 When he saw oncology, he had labs drawn and plans were begun for chemotherapy for pancreatic cancer.  His wife also noted his eyes becoming more yellow.  His total bilirubin was found to be increasing and GI was called.  It was thought that he had developed a biliary obstruction and was set up for a PTC drain.  He underwent MRCP yesterday.  This now revealed a new colonic obstruction secondary at the distal transverse colon secondary to a soft tissue mass 4.7 cm x 3.1 cm.  He underwent PTC placement to relieve biliary obstruction today.  He denies any recent chills, fevers, or chest pain.  He has some occasional abdominal pain with nausea and vomiting.  This mass in his abdomen just deep to his umbilicus has been enlarging per the wife.  It is tender.  He has not had any flatus or bowel movement in several days and feels that his bowel are swollen.  He complains of a 40 pound weight loss and very  poor appetite.  He denies any headaches blurry vision.  Denies any numbness or paresthesias.  He is being admitted to the hospital for further evaluation and management of a bowel obstruction with associated nausea vomiting and obstipation.  Review of Systems: As per HPI otherwise 10 point review of systems negative.    Past Medical History:  Diagnosis Date  . Adenocarcinoma of lung, right ([place]) 03/15/2017  . Pancreatic cancer [facility])     Past Surgical History:  Procedure Laterality Date  . EUS N/A 02/18/2017   Procedure: UPPER ENDOSCOPIC ULTRASOUND (EUS) LINEAR;  Surgeon: [name], MD;  Location: [facility] ENDOSCOPY;  Service: Endoscopy;  Laterality: N/A;  . FINE NEEDLE ASPIRATION N/A 02/18/2017   Procedure: FINE NEEDLE ASPIRATION (FNA) LINEAR;  Surgeon: [name], MD;  Location: [facility] ENDOSCOPY;  Service: Endoscopy;  Laterality: N/A;  pancreatic mass, needs biopsy.    Marland Kitchen PORTACATH PLACEMENT Right 03/16/2017   Procedure: INSERTION PORT-A-CATH;  Surgeon: [name], MD;  Location: [facility] ORS;  Service: General;  Laterality: Right;     reports that he quit smoking about 2 years ago. His smoking use included cigarettes. He has a 40.00 pack-year smoking history. he has never used smokeless tobacco. He reports that he does not drink alcohol or use drugs.  No Known Allergies  Family History  Problem Relation Age of Onset  . Cancer Mother        pancreas  .  Cancer Father        prostate    Prior to Admission medications   Medication Sig Start Date End Date Taking? Authorizing Provider  calcium carbonate (TUMS - DOSED IN MG ELEMENTAL CALCIUM) 500 MG chewable tablet Chew 1 tablet by mouth daily.   Yes [provider]  senna (SENOKOT) 8.6 MG TABS tablet Take 1 tablet (8.6 mg total) by mouth 2 (two) times daily. 02/17/17  Yes Enid Derry, [place], DO  dronabinol (MARINOL) 2.5 MG capsule Take 1 capsule (2.5 mg total) by mouth 2 (two) times daily before a meal. 03/15/17   Alla Feeling,  NP  enoxaparin (LOVENOX) 80 MG/0.8ML injection Inject 0.8 mLs (80 mg total) into the skin daily. 02/23/17   Alla Feeling, NP  lidocaine-prilocaine (EMLA) cream Apply to affected area once 03/08/17   Truitt Merle, MD  magnesium citrate SOLN Take 1 Bottle by mouth once as needed for moderate constipation.    [provider]  ondansetron (ZOFRAN) 8 MG tablet Take 1 tablet (8 mg total) by mouth 2 (two) times daily as needed (Nausea or vomiting). 03/08/17   Truitt Merle, MD  oxyCODONE (OXY IR/ROXICODONE) 5 MG immediate release tablet Take 1 tablet (5 mg total) by mouth every 4 (four) hours as needed for severe pain. 03/08/17   Truitt Merle, MD  oxyCODONE-acetaminophen (PERCOCET/ROXICET) 5-325 MG tablet Take 1-2 tablets by mouth every 6 (six) hours as needed for severe pain. 02/19/17   Verner Mould, MD  prochlorperazine (COMPAZINE) 10 MG tablet Take 1 tablet (10 mg total) by mouth every 6 (six) hours as needed (Nausea or vomiting). 03/08/17   Truitt Merle, MD  simethicone (MYLICON) 80 MG chewable tablet Chew 80 mg by mouth every 6 (six) hours as needed for flatulence.    [provider]  traZODone (DESYREL) 100 MG tablet Take 1 tablet (100 mg total) by mouth at bedtime. 02/26/17   Gildardo Pounds, NP    Physical Exam: Vitals:   03/18/17 1048 03/18/17 1100 03/18/17 1130 03/18/17 1200  BP: 119/85 127/85 109/85 118/77  Pulse: 84 84 80 86  Resp: 14 (!) _0 Temp:      SpO2: 97% 98% 99% 98%  Weight:      Height:       .TCS Constitutional: NAD, calm, comfortable; very thin and emaciated with temporal wasting Vitals:   03/18/17 1048 03/18/17 1100 03/18/17 1130 03/18/17 1200  BP: 119/85 127/85 109/85 118/77  Pulse: 84 84 80 86  Resp: 14 (!) _1 Temp:      SpO2: 97% 98% 99% 98%  Weight:      Height:       Eyes: PERRL, lids and conjunctivae normal ENMT: Mucous membranes are moist. Posterior pharynx clear of any exudate or lesions.Normal dentition.  Neck: normal,  supple, no masses, no thyromegaly Respiratory: clear to auscultation bilaterally, no wheezing, no crackles. Normal respiratory effort. No accessory muscle use.  Cardiovascular: Regular rate and rhythm, no murmurs / rubs / gallops. No extremity edema. 2+ pedal pulses. No carotid bruits.  Abdomen: no tenderness, no masses palpated. No hepatosplenomegaly. Bowel sounds absent.  Scaphoid abdomen is present Musculoskeletal: no clubbing / cyanosis. No joint deformity upper and lower extremities. Good ROM, no contractures. Normal muscle tone.  Skin: no rashes, lesions, ulcers. No induration Neurologic: CN 2-12 grossly intact. Sensation intact, DTR normal. Strength 5/5 in all 4.  Psychiatric: Normal judgment and insight. Alert and oriented x 3. Normal mood.  Nodes: No anterior or posterior chain lymphadenopathy no supraclavicular lymphadenopathy patient does have a rather large umbilical lymph node consistent with a Sister Wynona Dove node.   Labs on Admission: I have personally reviewed following labs and imaging studies  CBC: Recent Labs  Lab 03/15/17 1320 03/18/17 0816  WBC  --  9.3  NEUTROABS 4.5  --   HGB  --  11.3*  HCT 41.6 35.6*  MCV 85.8 86.4  PLT  --  169   Basic Metabolic Panel: Recent Labs  Lab 03/15/17 1320 03/18/17 0816  NA 136 138  K 4.3 3.7  CL 98 97*  CO2 29 34*  GLUCOSE 126 133*  BUN 22 21*  CREATININE  --  0.65  CALCIUM 9.9 9.0   GFR: Estimated Creatinine Clearance: 59 mL/min (by C-G formula based on SCr of 0.65 mg/dL). Liver Function Tests: Recent Labs  Lab 03/15/17 1320 03/18/17 0816  AST 92* 141*  ALT 146* 129*  ALKPHOS 354* 296*  BILITOT 7.9* 7.5*  PROT 7.6 6.5  ALBUMIN 3.6 3.1*   No results for input(s): LIPASE, AMYLASE in the last 168 hours. No results for input(s): AMMONIA in the last 168 hours. Coagulation Profile: Recent Labs  Lab 03/16/17 0750 03/18/17 0816  INR 0.95 0.96   Cardiac Enzymes: No results for input(s): CKTOTAL, CKMB,  CKMBINDEX, TROPONINI in the last 168 hours. BNP (last 3 results) No results for input(s): PROBNP in the last 8760 hours. HbA1C: No results for input(s): HGBA1C in the last 72 hours. CBG: Recent Labs  Lab 03/16/17 1250  GLUCAP 103*   Lipid Profile: No results for input(s): CHOL, HDL, LDLCALC, TRIG, CHOLHDL, LDLDIRECT in the last 72 hours. Thyroid Function Tests: No results for input(s): TSH, T4TOTAL, FREET4, T3FREE, THYROIDAB in the last 72 hours. Anemia Panel: No results for input(s): VITAMINB12, FOLATE, FERRITIN, TIBC, IRON, RETICCTPCT in the last 72 hours. Urine analysis:    Component Value Date/Time   COLORURINE AMBER (A) 02/26/2017 2109   APPEARANCEUR HAZY (A) 02/26/2017 2109   LABSPEC 1.023 02/26/2017 2109   PHURINE 6.0 02/26/2017 2109   GLUCOSEU NEGATIVE 02/26/2017 2109   HGBUR NEGATIVE 02/26/2017 2109   BILIRUBINUR NEGATIVE 02/26/2017 2109   [place] NEGATIVE 02/26/2017 2109   PROTEINUR NEGATIVE 02/26/2017 2109   NITRITE NEGATIVE 02/26/2017 2109   LEUKOCYTESUR NEGATIVE 02/26/2017 2109    Radiological Exams on Admission: Mr 3d Recon At Scanner  Result Date: 03/18/2017 CLINICAL DATA:  Rising abnormal liver function tests. Pancreatic carcinoma. EXAM: MRI ABDOMEN WITHOUT AND WITH CONTRAST (INCLUDING MRCP) TECHNIQUE: Multiplanar multisequence MR imaging of the abdomen was performed both before and after the administration of intravenous contrast. Heavily T2-weighted images of the biliary and pancreatic ducts were obtained, and three-dimensional MRCP images were rendered by post processing. CONTRAST:  25m MULTIHANCE GADOBENATE DIMEGLUMINE 529 MG/ML IV SOLN COMPARISON:  02/15/2017 FINDINGS: Lower chest: No acute findings. Hepatobiliary: No hepatic masses identified. Intrahepatic biliary ductal dilatation in the left hepatic lobe shows no significant change, however there is increased intrahepatic dilatation seen in the right lobe since prior study. Gallbladder contains T1  hyperintense sludge, but no evidence of dilatation or pericholecystic inflammatory changes. Pancreas: Peripherally enhancing mass with central necrosis is again seen in the pancreatic body measuring 4.2 x 2.9 cm on image 43/1103, which is not significant change since previous study. Atrophy and ductal dilatation is again seen in the tail. 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 Spleen:  Within normal limits in size  and appearance. Adrenals/Urinary Tract: No masses identified. No evidence of hydronephrosis. Stomach/Bowel: Marked dilatation of the ascending and transverse colon is seen which is a new finding. There is a enhancing mass at the transition point in the distal transverse colon which measures 4.7 x 3.6 cm on image 49/1104, suspicious for metastasis, with primary colon carcinoma considered less likely. Vascular/Lymphatic: Lymphadenopathy seen in the peripancreatic region and extending in the porta hepatis which in cases and narrows the proximal main portal vein and involves the liver hilum. In the liver hilum, this measures 4.2 x 3.3 cm on image 36/1103, compared to 3.1 x 2.6 cm previously. This causes obstruction of the common hepatic duct. No evidence of abdominal aortic aneurysm. Other:  Mild ascites, without significant change. Musculoskeletal:  No suspicious bone lesions identified. IMPRESSION: New colonic obstruction at the distal transverse colon due to 4.7 cm soft tissue mass. This is suspicious for metastatic disease, with primary colon carcinoma considered less likely. No significant change in 4 cm mass in the pancreatic body, consistent with primary pancreatic carcinoma. Again seen is encasement of the celiac axis and splenic vein thrombosis. Peripancreatic and porta hepatis lymphadenopathy. Porta hepatis lymphadenopathy is increased since previous study, causing obstruction of the common hepatic duct and new dilatation of intrahepatic bile ducts in  right lobe. Mild ascites, without significant change. Electronically Signed   By: Earle Gell M.D.   On: 03/18/2017 08:53   Dg Chest Port 1 View  Result Date: 03/16/2017 CLINICAL DATA:  Port-A-Cath placement. EXAM: PORTABLE CHEST 1 VIEW COMPARISON:  Earlier same day FINDINGS: Power port placed from a right internal jugular approach. Catheter tip is in the SVC 3.5 cm above the right atrium. No pneumothorax. Nodular shadow projecting over the inferior right hilum as seen previously. Lungs otherwise appear clear by radiography. IMPRESSION: Power poor well position with its tip in the SVC 3.5 cm above the right atrium. No pneumothorax. Pulmonary nodule overlying the right inferior hilum as shown by CT. Electronically Signed   By: Nelson Chimes M.D.   On: 03/16/2017 13:22   Mr Abdomen Mrcp Moise Boring Contast  Result Date: 03/18/2017 CLINICAL DATA:  Rising abnormal liver function tests. Pancreatic carcinoma. EXAM: MRI ABDOMEN WITHOUT AND WITH CONTRAST (INCLUDING MRCP) TECHNIQUE: Multiplanar multisequence MR imaging of the abdomen was performed both before and after the administration of intravenous contrast. Heavily T2-weighted images of the biliary and pancreatic ducts were obtained, and three-dimensional MRCP images were rendered by post processing. CONTRAST:  47m MULTIHANCE GADOBENATE DIMEGLUMINE 529 MG/ML IV SOLN COMPARISON:  02/15/2017 FINDINGS: Lower chest: No acute findings. Hepatobiliary: No hepatic masses identified. Intrahepatic biliary ductal dilatation in the left hepatic lobe shows no significant change, however there is increased intrahepatic dilatation seen in the right lobe since prior study. Gallbladder contains T1 hyperintense sludge, but no evidence of dilatation or pericholecystic inflammatory changes. Pancreas: Peripherally enhancing mass with central necrosis is again seen in the pancreatic body measuring 4.2 x 2.9 cm on image 43/1103, which is not significant change since previous study. Atrophy and  ductal dilatation is again seen in the tail. 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 Spleen:  Within normal limits in size and appearance. Adrenals/Urinary Tract: No masses identified. No evidence of hydronephrosis. Stomach/Bowel: Marked dilatation of the ascending and transverse colon is seen which is a new finding. There is a enhancing mass at the transition point in the distal transverse colon which measures 4.7 x 3.6 cm on image 49/1104,  suspicious for metastasis, with primary colon carcinoma considered less likely. Vascular/Lymphatic: Lymphadenopathy seen in the peripancreatic region and extending in the porta hepatis which in cases and narrows the proximal main portal vein and involves the liver hilum. In the liver hilum, this measures 4.2 x 3.3 cm on image 36/1103, compared to 3.1 x 2.6 cm previously. This causes obstruction of the common hepatic duct. No evidence of abdominal aortic aneurysm. Other:  Mild ascites, without significant change. Musculoskeletal:  No suspicious bone lesions identified. IMPRESSION: New colonic obstruction at the distal transverse colon due to 4.7 cm soft tissue mass. This is suspicious for metastatic disease, with primary colon carcinoma considered less likely. No significant change in 4 cm mass in the pancreatic body, consistent with primary pancreatic carcinoma. Again seen is encasement of the celiac axis and splenic vein thrombosis. Peripancreatic and porta hepatis lymphadenopathy. Porta hepatis lymphadenopathy is increased since previous study, causing obstruction of the common hepatic duct and new dilatation of intrahepatic bile ducts in right lobe. Mild ascites, without significant change. Electronically Signed   By: Earle Gell M.D.   On: 03/18/2017 08:53     Assessment/Plan Principal Problem:   Bowel obstruction (HCC) Active Problems:   Malignant neoplasm of body of pancreas (HCC)   Adenocarcinoma of lung, right (HCC)    Weight loss, abnormal   Splenic vein thrombosis   Goals of care, counseling/discussion   1.  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  He is being admitted to the hospital for attempted medical management of bowel obstruction.  I have consulted GI.  There is of course a great concern due to this being elated to a metastatic mass.  This is likely from his advanced pancreatic cancer.  This point patient's not actively vomiting are very uncomfortable we will try IV fluids and try to avoid an NG tube.  He is to be n.p.o. except for meds with sips.  2.  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  Especially since he also has a second primary lung cancer.  Patient is very realistic.  At this point we will consult Dr. Annamaria Boots and she can continue to discuss with the patient his reasonable options.  3.  Adenocarcinoma of the right lung: Given advanced pancreatic cancer on not sure treatment for this would be appropriate.  Will await further information from Dr. Annamaria Boots.  4.  Abnormal weight loss: Due to pancreatic cancer.  Patient currently on Marinol for tight stimulation.  5.  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  Patient fortunately not terribly symptomatic.  He does have increasing jaundice however.  6.  Goals of care counseling discussion: Patient and his wife have been together for 41 years.  They just got married on December 23 of this last year.  They are both very realistic about what to expect given the pancreatic cancer.  They are interested in care and procedures that can help the patient to feel better.  Have consulted palliative care for further discussion of goals.   DVT prophylaxis: Subcu heparin Code Status: DO NOT RESUSCITATE Family Communication: Spoke with patient's wife  who is present at the bedside Disposition Plan: Home with care from his wife even if he is to go home with hospice.  Likely discharge in  4-5 days Consults called: Dr. Annamaria Boots from oncology: Dr. Havery Moros from GI Admission status: Inpatient MedSurg   Lady Deutscher MD [facility] Pager [phone]  If 7PM-7AM, please contact night-coverage [email or web address] Password [facility]  03/18/2017, 12:34 PM

## 2017-03-18 NOTE — Progress Notes (Signed)
Palliative Medicine consult noted. Order states that pt will be here at 2:30, but order was not entered until 3:13. I attempted to call the RN Jearld Fenton so she could let the family know that we wouldn't be there until tomorrow or the next day.   Due to high referral volume, there will be a delay seeing this patient. Please call the Palliative Medicine Team office at [phone] if recommendations are needed in the interim.  Thank you for inviting [place] to see this patient.  Marjie Skiff Antoneo Ghrist, RN, BSN, [facility] Palliative Medicine Team 03/18/2017 4:06 PM Office [phone]

## 2017-03-18 NOTE — Procedures (Signed)
Interventional Radiology Procedure Note  Procedure: PTC and placement of a 54F int/ext biliary drainage catheter  Complications: None  Estimated Blood Loss: None  Recommendations: - Admit for observation - Drain to bag overnight, flush TID - If no complications, cap in am prior to DC - Follow up with Laurence Ferrari in [place] clinic in 2 weeks  Signed,  Criselda Peaches, MD

## 2017-03-18 NOTE — Consult Note (Signed)
[facility] Consult: 1:06 PM 03/18/2017  LOS: 0 days    Referring Provider: Dr Jeannine Kitten  Primary Care Physician:  Gildardo Pounds, NP Primary Gastroenterologist:  Dr. Havery Moros    Reason for Consultation:  New, obstructing colon mass per MRI   HPI: Fred Bradford is a 67 y.o. male.  Previously healthy and never went to MD.  Anorexia and vague abd pain, 10 # wt loss, constipation began 01/2017.  Noticed subcutaneous periumbilical masses.  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  LFTs were not elevated.  Underwent further workup as out pt after discharge  S/p EUS FNA 02/18/17.  MR abdomen 02/15/17: 4.3 cm mass in pancreatic body, bulky peripancreatic adenopathy extending into porta hepatis and obstructing left intrahepatic ducts.   PET scan 79/02/40: hypermetabolic lesion in pancreatic body.  Multiple peritoneal mets.  Minor ascites.  Several hypermetabolic lung nodules, suspicious for mets.   03/11/16 lung biopsy: "primary"adeno carcinoma.   S/p porta cath placement 03/16/17.   S/p PTC, internal/external drain placement today 1/10.  MRI/MRCP today 1/10: new 4.7 cm obstructing colon lesion at distal transverse colon suspicious for mets.  Stable 4 cm pancreatic body mass.  Increased peripancreatic, porta hepatis adenopathy is now causing obstruction of CHD and new right intrahepatic ductal dilatation.    Dr Barry Dienes confirms there is no surgical option for pancreatic ca.   Followed by Dr Burr Medico in oncology.  Anticipated to start palliative chemo, gemcitabine for pancreatic ca, soon.  On Lovenox for splenic vein thrombosis.  Abdominal pain is ongoing issue but controlled adequately with  oxycodone.  Vomits periodically, 2 x in last 5 days, and always post prandial.  Not eating enough to trigger more frequent N/V.  Less frequent stools, last BM was loose a couple of days ago.  Wt 145 # on 02/16/17 >> 104 # today More recently has developed jaundice. Prompting oncology to repeat LFTs.    LFTs normal until 02/26/17.  Rising since then: T bili 0.7 .. 7.9 .. 7.5, Alk phos 179.. 354.. 296.  AST/ALT 48/53.Marland Kitchen 92/146..  141/129.   Hgb 11.3, basline ~ 14.  Platelets, coags normal.   CA 19-9 137 on 03/15/17 Underwent the PTC drain placement today and now admitted due to finding of the new colon mass.  Has never met with Palliative care.      Past Medical History:  Diagnosis Date  . 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 Pancreatic cancer [facility])     Past Surgical History:  Procedure Laterality Date  . EUS N/A 02/18/2017   Procedure: UPPER ENDOSCOPIC ULTRASOUND (EUS) LINEAR;  Surgeon: [name], MD;  Location: [facility];  Service: Endoscopy;  Laterality: N/A;  . FINE NEEDLE ASPIRATION N/A 02/18/2017   Procedure: FINE NEEDLE ASPIRATION (FNA) LINEAR;  Surgeon: [name], MD;  Location: [facility];  Service: Endoscopy;  Laterality: N/A;  pancreatic mass, needs biopsy.    . IR INT EXT BILIARY DRAIN WITH CHOLANGIOGRAM  03/18/2017  . PORTACATH PLACEMENT Right 03/16/2017   Procedure: INSERTION PORT-A-CATH;  Surgeon: [name], MD;  Location: [facility] ORS;  Service: General;  Laterality: Right;    Prior to Admission medications   Medication Sig Start Date End Date Taking? Authorizing Provider  calcium carbonate (TUMS - DOSED IN MG ELEMENTAL CALCIUM) 500 MG chewable tablet Chew 1 tablet by mouth daily.   Yes [provider]  senna (SENOKOT) 8.6 MG TABS tablet Take 1 tablet (8.6 mg total) by mouth 2 (two) times daily. 02/17/17  Yes Enid Derry, [place], DO  dronabinol (MARINOL) 2.5 MG capsule Take 1 capsule (2.5 mg total) by mouth 2 (two) times daily before a meal.  03/15/17   Alla Feeling, NP  enoxaparin (LOVENOX) 80 MG/0.8ML injection Inject 0.8 mLs (80 mg total) into the skin daily. 02/23/17   Alla Feeling, NP  lidocaine-prilocaine (EMLA) cream Apply to affected area once 03/08/17   Truitt Merle, MD  magnesium citrate SOLN Take 1 Bottle by mouth once as needed for moderate constipation.    [provider]  ondansetron (ZOFRAN) 8 MG tablet Take 1 tablet (8 mg total) by mouth 2 (two) times daily as needed (Nausea or vomiting). 03/08/17   Truitt Merle, MD  oxyCODONE (OXY IR/ROXICODONE) 5 MG immediate release tablet Take 1 tablet (5 mg total) by mouth every 4 (four) hours as needed for severe pain. 03/08/17   Truitt Merle, MD  oxyCODONE-acetaminophen (PERCOCET/ROXICET) 5-325 MG tablet Take 1-2 tablets by mouth every 6 (six) hours as needed for severe pain. 02/19/17   Verner Mould, MD  prochlorperazine (COMPAZINE) 10 MG tablet Take 1 tablet (10 mg total) by mouth every 6 (six) hours as needed (Nausea or vomiting). 03/08/17   Truitt Merle, MD  simethicone (MYLICON) 80 MG chewable tablet Chew 80 mg by mouth every 6 (six) hours as needed for flatulence.    [provider]  traZODone (DESYREL) 100 MG tablet Take 1 tablet (100 mg total) by mouth at bedtime. 02/26/17   Gildardo Pounds, NP    Scheduled Meds: . fentaNYL      . lidocaine      . midazolam      . sodium chloride flush  5 mL Intravenous Q8H   Infusions: . sodium chloride    . piperacillin-tazobactam (ZOSYN)  IV 3.375 g (03/18/17 1013)   PRN Meds: fentaNYL, HYDROcodone-acetaminophen, lidocaine (PF), midazolam   Allergies as of 03/18/2017  . (No Known Allergies)    Family History  Problem Relation Age of Onset  . Cancer Mother        pancreas  . Cancer Father        prostate    Social History   Socioeconomic History  . Marital status: Single    Spouse name: Not on file  . Number of children: Not on file  . Years of education: Not on file  . Highest education  level: Not on file  Social Needs  . Financial resource strain: Not on file  . Food insecurity - worry: Not on file  . Food insecurity - inability: Not on file  . Transportation needs - medical: Not on file  . Transportation needs - non-medical: Not on file  Occupational History  . Not on file  Tobacco Use  . Smoking status: Former Smoker    Packs/day: 1.00    Years: 40.00    Pack years: 40.00    Types: Cigarettes    Last attempt to quit: 2017  Years since quitting: 2.0  . Smokeless tobacco: Never Used  Substance and Sexual Activity  . Alcohol use: No    Frequency: Never    Comment: 1 beer daily, has not consumed lately   . Drug use: No  . Sexual activity: Not on file  Other Topics Concern  . Not on file  Social History Narrative  . Not on file    REVIEW OF SYSTEMS: Constitutional:  Weakness fatigue ENT:  No nose bleeds Pulm:  No cough or dyspnea CV:  No palpitations, no LE edema.  GU:  Dark urine.  No hematuria, no frequency GI:  Per HPI Heme:  No unusual bleeding or bruising   Transfusions:  none Neuro:  No headaches, no peripheral tingling or numbness.  No seizures, no syncope.   Derm:  No itching, no rash or sores.  Endocrine:  No sweats or chills.  No polyuria or dysuria Immunization:  None recorded.   Travel:  None beyond local counties in last few months.    PHYSICAL EXAM: Vital signs in last 24 hours: Vitals:   03/18/17 1230 03/18/17 1300  BP: 110/82 101/69  Pulse: 84 79  Resp: 20 18  Temp:    SpO2: 98% 98%   Wt Readings from Last 3 Encounters:  03/18/17 47.2 kg (104 lb)  03/16/17 47.4 kg (104 lb 8 oz)  03/15/17 47.5 kg (104 lb 11.2 oz)    General: thin, alert, comfortable AAM Head:  No asymmetry or swelling.  Prominent skeletal structure.  No trauma  Eyes:  +icterus, no conj pallor.  EOMI Ears:  Not HOH  Nose:  No congestion or discharge.   Mouth:  Moist, clear oral MM Neck:  No mass or JVD Lungs:  Clear bil.  No labored breathing or  cough Heart: RRR.  No mrg.  S1, s2 present.   Abdomen:  Thin, ND, NT.  Non-tender mass as umbilicus. Decreased plasticity of upper abdomen.  Biliary drain with 100 ml of clear green liquid in biliary drain bag.    Rectal: deferred   Musc/Skeltl: no joint swelling or deformity.   Extremities:  No CCE  Neurologic:  Oriented x 3.  No tremor.  Moves all 4 limbs, strength not tested.  No gross deficits Psych: pleasant, calm.   Skin:  jaundiced   Intake/Output from previous day: No intake/output data recorded. Intake/Output this shift: No intake/output data recorded.  LAB RESULTS: Recent Labs    03/15/17 1320 03/18/17 0816  WBC  --  9.3  HGB  --  11.3*  HCT 41.6 35.6*  PLT  --  372   BMET Lab Results  Component Value Date   NA 138 03/18/2017   NA 136 03/15/2017   NA 135 02/26/2017   K 3.7 03/18/2017   K 4.3 03/15/2017   K 4.2 02/26/2017   CL 97 (L) 03/18/2017   CL 98 03/15/2017   CL 95 (L) 02/26/2017   CO2 34 (H) 03/18/2017   CO2 29 03/15/2017   CO2 31 02/26/2017   GLUCOSE 133 (H) 03/18/2017   GLUCOSE 126 03/15/2017   GLUCOSE 114 (H) 02/26/2017   BUN 21 (H) 03/18/2017   BUN 22 03/15/2017   BUN 23 (H) 02/26/2017   CREATININE 0.65 03/18/2017   CREATININE 0.84 02/26/2017   CREATININE 0.69 02/16/2017   CALCIUM 9.0 03/18/2017   CALCIUM 9.9 03/15/2017   CALCIUM 9.8 02/26/2017   LFT Recent Labs    03/15/17 1320 03/18/17 0816  PROT 7.6 6.5  ALBUMIN  3.6 3.1*  AST 92* 141*  ALT 146* 129*  ALKPHOS 354* 296*  BILITOT 7.9* 7.5*   PT/INR Lab Results  Component Value Date   INR 0.96 03/18/2017   INR 0.95 03/16/2017   INR 0.96 03/11/2017   Hepatitis Panel No results for input(s): HEPBSAG, HCVAB, HEPAIGM, HEPBIGM in the last 72 hours. C-Diff No components found for: CDIFF Lipase     Component Value Date/Time   LIPASE 26 02/26/2017 1738    Drugs of Abuse  No results found for: LABOPIA, COCAINSCRNUR, LABBENZ, AMPHETMU, THCU, LABBARB   RADIOLOGY  STUDIES: Mr 3d Recon At Scanner Mr Abdomen Mrcp W Wo Contrast Result Date: 03/18/2017 CLINICAL DATA:  Rising abnormal liver function tests. Pancreatic carcinoma. EXAM: MRI ABDOMEN WITHOUT AND WITH CONTRAST (INCLUDING MRCP) TECHNIQUE: Multiplanar multisequence MR imaging of the abdomen was performed both before and after the administration of intravenous contrast. Heavily T2-weighted images of the biliary and pancreatic ducts were obtained, and three-dimensional MRCP images were rendered by post processing. CONTRAST:  76m MULTIHANCE GADOBENATE DIMEGLUMINE 529 MG/ML IV SOLN COMPARISON:  02/15/2017 FINDINGS: Lower chest: No acute findings. Hepatobiliary: No hepatic masses identified. Intrahepatic biliary ductal dilatation in the left hepatic lobe shows no significant change, however there is increased intrahepatic dilatation seen in the right lobe since prior study. Gallbladder contains T1 hyperintense sludge, but no evidence of dilatation or pericholecystic inflammatory changes. Pancreas: Peripherally enhancing mass with central necrosis is again seen in the pancreatic body measuring 4.2 x 2.9 cm on image 43/1103, which is not significant change since previous study. Atrophy and ductal dilatation is again seen in the tail. 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 Spleen:  Within normal limits in size and appearance. Adrenals/Urinary Tract: No masses identified. No evidence of hydronephrosis. Stomach/Bowel: Marked dilatation of the ascending and transverse colon is seen which is a new finding. There is a enhancing mass at the transition point in the distal transverse colon which measures 4.7 x 3.6 cm on image 49/1104, suspicious for metastasis, with primary colon carcinoma considered less likely. Vascular/Lymphatic: Lymphadenopathy seen in the peripancreatic region and extending in the porta hepatis which in cases and narrows the proximal main portal vein and involves the  liver hilum. In the liver hilum, this measures 4.2 x 3.3 cm on image 36/1103, compared to 3.1 x 2.6 cm previously. This causes obstruction of the common hepatic duct. No evidence of abdominal aortic aneurysm. Other:  Mild ascites, without significant change. Musculoskeletal:  No suspicious bone lesions identified. IMPRESSION: New colonic obstruction at the distal transverse colon due to 4.7 cm soft tissue mass. This is suspicious for metastatic disease, with primary colon carcinoma considered less likely. No significant change in 4 cm mass in the pancreatic body, consistent with primary pancreatic carcinoma. Again seen is encasement of the celiac axis and splenic vein thrombosis. Peripancreatic and porta hepatis lymphadenopathy. Porta hepatis lymphadenopathy is increased since previous study, causing obstruction of the common hepatic duct and new dilatation of intrahepatic bile ducts in right lobe. Mild ascites, without significant change. Electronically Signed   By: JEarle GellM.D.   On: 03/18/2017 08:53   Dg Chest Port 1 View Result Date: 03/16/2017 CLINICAL DATA:  Port-A-Cath placement. EXAM: PORTABLE CHEST 1 VIEW COMPARISON:  Earlier same day FINDINGS: Power port placed from a right internal jugular approach. Catheter tip is in the SVC 3.5 cm above the right atrium. No pneumothorax. Nodular shadow projecting over the inferior right hilum as seen  previously. Lungs otherwise appear clear by radiography. IMPRESSION: Power poor well position with its tip in the SVC 3.5 cm above the right atrium. No pneumothorax. Pulmonary nodule overlying the right inferior hilum as shown by CT. Electronically Signed   By: Nelson Chimes M.D.   On: 03/16/2017 13:22    Ir Int Lianne Cure Biliary Drain With Cholangiogram Result Date: 03/18/2017 INDICATION: 67 year old male with pancreatic cancer and obstructed jaundice. He presents for percutaneous transhepatic cholangiography and biliary drain placement. EXAM: Percutaneous transhepatic  cholangiography and placement of an internal/external percutaneous biliary drain. MEDICATIONS: 3.375 g Zosyn; The antibiotic was administered within an appropriate time frame prior to the initiation of the procedure. ANESTHESIA/SEDATION: Moderate (conscious) sedation was employed during this procedure. A total of Versed 1 mg and Fentanyl 100 mcg was administered intravenously. Moderate Sedation Time: 25 minutes. The patient's level of consciousness and vital signs were monitored continuously by radiology nursing throughout the procedure under my direct supervision. FLUOROSCOPY TIME:  Fluoroscopy Time: 7 minutes 30 seconds (42 mGy). COMPLICATIONS: None immediate. PROCEDURE: Informed written consent was obtained from the patient after a thorough discussion of the procedural risks, benefits and alternatives. All questions were addressed. Maximal Sterile Barrier Technique was utilized including caps, mask, sterile gowns, sterile gloves, sterile drape, hand hygiene and skin antiseptic. A timeout was performed prior to the initiation of the procedure. Under fluoroscopic guidance, a Chiba catheter was advanced intercostal at the anterior axillary line into the liver. As the needle was pulled back, a gentle hand injection of contrast material was performed. There was near immediate opacification of the biliary tree. A percutaneous transhepatic cholangiogram was performed. 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 There is minimal contrast entering the left biliary system. A suitable tertiary radical from the right posterior ductal system was identified as an access point. A 21 gauge Accustick needle was then carefully used to puncture the tertiary radicle. A wire was advanced into the central right hepatic ducts. The needle was exchanged over the wire for the Accustick sheath. The Accustick sheath was advanced into the proximal common  hepatic duct just above the occlusion. A road runner wire was successfully navigated across the occlusion and into the distal common bile duct. The Accustick sheath was advanced into the distal common bile duct. Contrast injection confirms patency of the ampulla. The roadrunner wire was advanced through the ampulla and into the duodenum. A 4 French glide cath was advanced over the wire into the duodenum. The roadrunner wire was then exchanged for a superstiff Amplatz wire. The glide catheter and Accustick sheath were removed. The skin tract was dilated to 10 [place]. A Cook 10.2 [place] biliary drainage catheter was then advanced over the wire and formed within the duodenum. The catheter was flushed. Excellent patency. There is already decompression of the biliary tree. The catheter was then flushed with saline and secured to the skin with 0 Prolene suture. A gravity bag was placed.  The patient tolerated the procedure well. IMPRESSION: 1. 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 Minimal contrast enters the left biliary tree from the right. 2. Successful placement of a 10 French internal/external percutaneous biliary drainage catheter. Signed, Criselda Peaches, MD [facility] Electronically Signed   By: Jacqulynn Cadet M.D.   On: 03/18/2017 12:49     IMPRESSION:   *  Pancreatic cancer diagnoses confirmed 02/18/17 at EUS/FNA.  Now with progressive metastatic adenopathy at port hepatis causing obstructive jaundice.  S/p PTC int/ext drain placement today.  DRrFeng had been planning to initiate gemcitabine soon and the porta cath is placed.  However with the new finding of colon mass, this may need to be reconsidered.     *  Lung cancer, looks to be a primary cancer by lung bx of 03/11/17.    *  New, likely metastatic, transverse colon mass,  not seen on MR of 02/15/18. Episodes of post prandial vomiting and low level nausea may be due to the obstructing mass.  Not taking much/enough PO to trigger more frequent sxs.      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     PLAN:     *  No role for colonoscopy.  Needs to have oncology weigh in on latest findings. May be a role for surgical opinion re: colon mass, but would defer to oncology first.     Likely would benefit from palliative care [place] discussion, he is open to this but, understandably, wants his wife involved.    Azucena Freed  03/18/2017, 1:06 PM Pager: [phone]  GI ATTENDING  History, laboratories, x-rays reviewed. Patient seen and examined. Agree with comprehensive consultation note as outlined above. Unfortunate situation. Gentleman with widely metastatic pancreatic cancer. Recent biliary obstruction for which she has undergone percutaneous drainage. Now with colonic obstruction secondary to metastatic mass. Unfortunately, endoscopic palliation with stenting is not a viable option (100% chance of stent migration with or without possible perforation). Agree with palliative care as suggested by oncology. Please call if you have questions. Will sign off  John N. Geri Seminole., M.D. [facility] Division of Gastroenterology

## 2017-03-18 NOTE — H&P (Signed)
Chief Complaint: biliary obstruction  Referring Physician:Dr. Aberdeen Cellar  Supervising Physician: Jacqulynn Cadet  Patient Status: [facility] - Out-pt  HPI: Fred Bradford is a 67 y.o. male with recently diagnostic pancreatic cancer with concern for invasion of the splenic artery and vein.  It was confirmed with a biopsy during ERCP.  He was referred to Dr. Barry Dienes who felt like this was likely to be unresectable pending further imaging.  He ultimately has had a PAC placed on 03-15-17 by Dr. Barry Dienes to initiate treatment.  He was referred to oncology as well who in further imaging found a lung lesion.  The patient came in on 03-11-17 to have a lung biopsy to confirm a met vs another primary.  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 When he saw oncology, he had labs drawn.  His wife also noted his eyes becoming more yellow.  His TB was found to be increasing and GI was called.  It was thought that he had developed a biliary obstruction and was set up for a PTC drain.  He underwent MRCP yesterday.  This now revealed a new colonic obstruction secondary at the distal transverse colon secondary to a soft tissue mass.  He presents today for PTC placement to relieve biliary obstruction.  He denies any recent chills, fevers, or chest pain.  He has some occasional abdominal pain with nausea and vomiting.  This mass in his abdomen just deep to his umbilicus has been enlarging per the wife.  It is tender.  Past Medical History:  Past Medical History:  Diagnosis Date  . 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 Pancreatic cancer [facility])     Past Surgical History:  Past Surgical History:  Procedure Laterality Date  . EUS N/A 02/18/2017   Procedure: UPPER ENDOSCOPIC ULTRASOUND (EUS) LINEAR;  Surgeon: [name], MD;  Location: [facility] ENDOSCOPY;  Service: Endoscopy;  Laterality: N/A;  . FINE NEEDLE ASPIRATION N/A 02/18/2017   Procedure: FINE NEEDLE ASPIRATION (FNA) LINEAR;   Surgeon: [name], MD;  Location: [facility] ENDOSCOPY;  Service: Endoscopy;  Laterality: N/A;  pancreatic mass, needs biopsy.    Marland Kitchen PORTACATH PLACEMENT Right 03/16/2017   Procedure: INSERTION PORT-A-CATH;  Surgeon: [name], MD;  Location: [facility] ORS;  Service: General;  Laterality: Right;    Family History:  Family History  Problem Relation Age of Onset  . Cancer Mother        pancreas  . Cancer Father        prostate    Social History:  reports that he quit smoking about 2 years ago. His smoking use included cigarettes. He has a 40.00 pack-year smoking history. he has never used smokeless tobacco. He reports that he does not drink alcohol or use drugs.  Allergies: No Known Allergies  Medications: Medications reviewed in epic  Please HPI for pertinent positives, otherwise complete 10 system ROS negative.  Mallampati Score: MD Evaluation Airway: WNL Heart: WNL Abdomen: Other (comments) Abdomen comments: moderate sized umbilical nodule Chest/ Lungs: WNL ASA  Classification: 3 Mallampati/Airway Score: Two  Physical Exam: BP 110/85   Pulse 98   Temp 98.7 F (37.1 C)   Resp 18   Ht _0  (1.753 m)   Wt 104 lb (47.2 kg)   SpO2 100%   BMI 15.36 kg/m  Body mass index is 15.36 kg/m. General: pleasant, cachectic black male who is laying in bed in NAD HEENT: head is normocephalic, atraumatic.  Sclera  are noninjected.  PERRL.  Ears and nose without any masses or lesions.  Mouth is pink and moist Heart: regular, rate, and rhythm.  Normal s1,s2. No obvious murmurs, gallops, or rubs noted.  Palpable radial pulses bilaterally Lungs: CTAB, no wheezes, rhonchi, or rales noted.  Respiratory effort nonlabored.  PAC in RUC Abd: soft, mild diffuse tendernes, ND, +BS, abdominal mass deep to umbilicus, about golf ball size, maybe slightly bigger. Psych: A&Ox3 with an appropriate affect.   Labs: Results for orders placed or performed during the hospital encounter of 03/18/17 (from the  past 48 hour(s))  APTT     Status: None   Collection Time: 03/18/17  8:16 AM  Result Value Ref Range   aPTT 27 24 - 36 seconds  CBC     Status: Abnormal   Collection Time: 03/18/17  8:16 AM  Result Value Ref Range   WBC 9.3 4.0 - 10.5 K/uL   RBC 4.12 (L) 4.22 - 5.81 MIL/uL   Hemoglobin 11.3 (L) 13.0 - 17.0 g/dL   HCT 35.6 (L) 39.0 - 52.0 %   MCV 86.4 78.0 - 100.0 fL   MCH 27.4 26.0 - 34.0 pg   MCHC 31.7 30.0 - 36.0 g/dL   RDW 14.8 11.5 - 15.5 %   Platelets 372 150 - 400 K/uL  Comprehensive metabolic panel     Status: Abnormal   Collection Time: 03/18/17  8:16 AM  Result Value Ref Range   Sodium 138 135 - 145 mmol/L   Potassium 3.7 3.5 - 5.1 mmol/L   Chloride 97 (L) 101 - 111 mmol/L   CO2 34 (H) 22 - 32 mmol/L   Glucose, Bld 133 (H) 65 - 99 mg/dL   BUN 21 (H) 6 - 20 mg/dL   Creatinine, Ser 0.65 0.61 - 1.24 mg/dL   Calcium 9.0 8.9 - 10.3 mg/dL   Total Protein 6.5 6.5 - 8.1 g/dL   Albumin 3.1 (L) 3.5 - 5.0 g/dL   AST 141 (H) 15 - 41 U/L   ALT 129 (H) 17 - 63 U/L   Alkaline Phosphatase 296 (H) 38 - 126 U/L   Total Bilirubin 7.5 (H) 0.3 - 1.2 mg/dL   GFR calc non Af Amer >60 >60 mL/min   GFR calc Af Amer >60 >60 mL/min    Comment: (NOTE) The eGFR has been calculated using the CKD EPI equation. This calculation has not been validated in all clinical situations. eGFR's persistently <60 mL/min signify possible Chronic Kidney Disease.    Anion gap 7 5 - 15  Protime-INR     Status: None   Collection Time: 03/18/17  8:16 AM  Result Value Ref Range   Prothrombin Time 12.7 11.4 - 15.2 seconds   INR 0.96     Imaging: US Abdomen Complete  Result Date: 03/16/2017 CLINICAL DATA:  67 year old male with pancreatic cancer and jaundice. Initial encounter. EXAM: ABDOMEN ULTRASOUND COMPLETE COMPARISON:  03/05/2017. FINDINGS: Gallbladder: Echogenic material within the gallbladder with mild gallbladder wall thickening measuring up to 3.2 mm. On prior PET-CT, radiopaque material seen within  the gallbladder which may represent stones or sludge. Patient was not tender over the gallbladder during scanning per ultrasound technologist. Common bile duct: Diameter: 6.9 mm. Liver: Intrahepatic biliary duct prominence. Suspect mild periportal edema. Portal vein is patent on color Doppler imaging with normal direction of blood flow towards the liver. IVC: No abnormality visualized. Pancreas: Heterogeneous pancreatic head mass. Spleen: Size and appearance within normal limits. Right Kidney: Length: 10 cm.  Echogenicity within normal limits. No mass or hydronephrosis visualized. Left Kidney: Length: 10.1 cm. Echogenicity within normal limits. No mass or hydronephrosis visualized. Abdominal aorta: Atherosclerotic changes with slight ectasia without focal aneurysm with maximal AP dimension 2.6 cm. Other findings: None. IMPRESSION: Pancreatic head mass with subsequent dilated common bile duct and mild intrahepatic biliary duct dilation. Mild periportal edema also suspected. Gallbladder sludge/stones with mild gallbladder wall thickening without tenderness over the gallbladder during scanning. Atherosclerotic changes aorta without focal aneurysm. Electronically Signed   By: Genia Del M.D.   On: 03/16/2017 10:37   Mr 3d Recon At Scanner  Result Date: 03/18/2017 CLINICAL DATA:  Rising abnormal liver function tests. Pancreatic carcinoma. EXAM: MRI ABDOMEN WITHOUT AND WITH CONTRAST (INCLUDING MRCP) TECHNIQUE: Multiplanar multisequence MR imaging of the abdomen was performed both before and after the administration of intravenous contrast. Heavily T2-weighted images of the biliary and pancreatic ducts were obtained, and three-dimensional MRCP images were rendered by post processing. CONTRAST:  28m MULTIHANCE GADOBENATE DIMEGLUMINE 529 MG/ML IV SOLN COMPARISON:  02/15/2017 FINDINGS: Lower chest: No acute findings. Hepatobiliary: No hepatic masses identified. Intrahepatic biliary ductal dilatation in the left hepatic  lobe shows no significant change, however there is increased intrahepatic dilatation seen in the right lobe since prior study. Gallbladder contains T1 hyperintense sludge, but no evidence of dilatation or pericholecystic inflammatory changes. Pancreas: Peripherally enhancing mass with central necrosis is again seen in the pancreatic body measuring 4.2 x 2.9 cm on image 43/1103, which is not significant change since previous study. Atrophy and ductal dilatation is again seen in the tail. 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 Spleen:  Within normal limits in size and appearance. Adrenals/Urinary Tract: No masses identified. No evidence of hydronephrosis. Stomach/Bowel: Marked dilatation of the ascending and transverse colon is seen which is a new finding. There is a enhancing mass at the transition point in the distal transverse colon which measures 4.7 x 3.6 cm on image 49/1104, suspicious for metastasis, with primary colon carcinoma considered less likely. Vascular/Lymphatic: Lymphadenopathy seen in the peripancreatic region and extending in the porta hepatis which in cases and narrows the proximal main portal vein and involves the liver hilum. In the liver hilum, this measures 4.2 x 3.3 cm on image 36/1103, compared to 3.1 x 2.6 cm previously. This causes obstruction of the common hepatic duct. No evidence of abdominal aortic aneurysm. Other:  Mild ascites, without significant change. Musculoskeletal:  No suspicious bone lesions identified. IMPRESSION: New colonic obstruction at the distal transverse colon due to 4.7 cm soft tissue mass. This is suspicious for metastatic disease, with primary colon carcinoma considered less likely. No significant change in 4 cm mass in the pancreatic body, consistent with primary pancreatic carcinoma. Again seen is encasement of the celiac axis and splenic vein thrombosis. Peripancreatic and porta hepatis lymphadenopathy. Porta hepatis  lymphadenopathy is increased since previous study, causing obstruction of the common hepatic duct and new dilatation of intrahepatic bile ducts in right lobe. Mild ascites, without significant change. Electronically Signed   By: JEarle GellM.D.   On: 03/18/2017 08:53   Dg Chest Port 1 View  Result Date: 03/16/2017 CLINICAL DATA:  Port-A-Cath placement. EXAM: PORTABLE CHEST 1 VIEW COMPARISON:  Earlier same day FINDINGS: Power port placed from a right internal jugular approach. Catheter tip is in the SVC 3.5 cm above the right atrium. No pneumothorax. Nodular shadow projecting over the inferior right hilum as seen previously. Lungs otherwise appear clear  by radiography. IMPRESSION: Power poor well position with its tip in the SVC 3.5 cm above the right atrium. No pneumothorax. Pulmonary nodule overlying the right inferior hilum as shown by CT. Electronically Signed   By: Nelson Chimes M.D.   On: 03/16/2017 13:22   Dg C-arm 1-60 Min-no Report  Result Date: 03/16/2017 Fluoroscopy was utilized by the requesting physician.  No radiographic interpretation.   Mr Abdomen Mrcp Moise Boring Contast  Result Date: 03/18/2017 CLINICAL DATA:  Rising abnormal liver function tests. Pancreatic carcinoma. EXAM: MRI ABDOMEN WITHOUT AND WITH CONTRAST (INCLUDING MRCP) TECHNIQUE: Multiplanar multisequence MR imaging of the abdomen was performed both before and after the administration of intravenous contrast. Heavily T2-weighted images of the biliary and pancreatic ducts were obtained, and three-dimensional MRCP images were rendered by post processing. CONTRAST:  48m MULTIHANCE GADOBENATE DIMEGLUMINE 529 MG/ML IV SOLN COMPARISON:  02/15/2017 FINDINGS: Lower chest: No acute findings. Hepatobiliary: No hepatic masses identified. Intrahepatic biliary ductal dilatation in the left hepatic lobe shows no significant change, however there is increased intrahepatic dilatation seen in the right lobe since prior study. Gallbladder contains T1  hyperintense sludge, but no evidence of dilatation or pericholecystic inflammatory changes. Pancreas: Peripherally enhancing mass with central necrosis is again seen in the pancreatic body measuring 4.2 x 2.9 cm on image 43/1103, which is not significant change since previous study. Atrophy and ductal dilatation is again seen in the tail. 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 Spleen:  Within normal limits in size and appearance. Adrenals/Urinary Tract: No masses identified. No evidence of hydronephrosis. Stomach/Bowel: Marked dilatation of the ascending and transverse colon is seen which is a new finding. There is a enhancing mass at the transition point in the distal transverse colon which measures 4.7 x 3.6 cm on image 49/1104, suspicious for metastasis, with primary colon carcinoma considered less likely. Vascular/Lymphatic: Lymphadenopathy seen in the peripancreatic region and extending in the porta hepatis which in cases and narrows the proximal main portal vein and involves the liver hilum. In the liver hilum, this measures 4.2 x 3.3 cm on image 36/1103, compared to 3.1 x 2.6 cm previously. This causes obstruction of the common hepatic duct. No evidence of abdominal aortic aneurysm. Other:  Mild ascites, without significant change. Musculoskeletal:  No suspicious bone lesions identified. IMPRESSION: New colonic obstruction at the distal transverse colon due to 4.7 cm soft tissue mass. This is suspicious for metastatic disease, with primary colon carcinoma considered less likely. No significant change in 4 cm mass in the pancreatic body, consistent with primary pancreatic carcinoma. Again seen is encasement of the celiac axis and splenic vein thrombosis. Peripancreatic and porta hepatis lymphadenopathy. Porta hepatis lymphadenopathy is increased since previous study, causing obstruction of the common hepatic duct and new dilatation of intrahepatic bile ducts in  right lobe. Mild ascites, without significant change. Electronically Signed   By: JEarle GellM.D.   On: 03/18/2017 08:53    Assessment/Plan 1. Biliary obstruction secondary to pancreatic cancer  This patient is very unfortunate.  He has pancreatic cancer along with new findings of primary lung adenocarcinoma as well.  He also has a soft tissue mass in his abdomen that may or may not be a met from his known malignancy vs a colonic malignancy.  Nonetheless, because of his biliary obstruction, he presents today for a PTC drain to help relieve this obstruction.  He has a blockage of the left and right hepatic ducts.  We will  attempt internal external drainage of the right hepatic duct given this drains more of the liver.  If this is found to be sufficient, then no further plans will be made to ultimately place a left-sided drain.  If this does not prove to adequately drain him, then he may need to return for a left-sided drain to be placed.  If we are unable to pass the obstruction today, then just an external drain will be placed.  If this is the case, we will arrange for follow up in about a week to return to try and place the internal component.  The ultimate goal of today is to place and internal external PTC drain today.  Because of the potential for transient sepsis with this type of blockage and subsequent drain placement, we have contacted the hospitalist for observation after drain placement today.  They are agreeable with this plan.  The patient and his wife are aware of the potential longevity of this drain.  They are also aware that if we are ever able, then we can try and convert this drain to a stent so his drains can be removed.  They know this will be determined over time and pending his prognosis and how he does.  Risks and benefits of PTC drain discussed with the patient including, but not limited to bleeding, infection which may lead to sepsis or even death and damage to adjacent  structures.  This interventional procedure involves the use of X-rays and because of the nature of the planned procedure, it is possible that we will have prolonged use of X-ray fluoroscopy.  Potential radiation risks to you include (but are not limited to) the following: - A slightly elevated risk for cancer  several years later in life. This risk is typically less than 0.5% percent. This risk is low in comparison to the normal incidence of human cancer, which is 33% for women and 50% for men according to the [employer]. - Radiation induced injury can include skin redness, resembling a rash, tissue breakdown / ulcers and hair loss (which can be temporary or permanent).   The likelihood of either of these occurring depends on the difficulty of the procedure and whether you are sensitive to radiation due to previous procedures, disease, or genetic conditions.   IF your procedure requires a prolonged use of radiation, you will be notified and given written instructions for further action.  It is your responsibility to monitor the irradiated area for the 2 weeks following the procedure and to notify your physician if you are concerned that you have suffered a radiation induced injury.    All of the patient's questions were answered, patient is agreeable to proceed.  Consent signed and in chart.   Thank you for this interesting consult.  I greatly enjoyed meeting Lenord Fralix and look forward to participating in their care.  A copy of this report was sent to the requesting provider on this date.  Electronically Signed: [name] 03/18/2017, 10:07 AM   I spent a total of    30 minutes in face to face in clinical consultation, greater than 50% of which was counseling/coordinating care for biliary obstruction

## 2017-03-19 ENCOUNTER — Inpatient Hospital Stay (HOSPITAL_COMMUNITY): Payer: BC Managed Care – PPO

## 2017-03-19 ENCOUNTER — Telehealth: Payer: Self-pay | Admitting: *Deleted

## 2017-03-19 DIAGNOSIS — C3411 Malignant neoplasm of upper lobe, right bronchus or lung: Secondary | ICD-10-CM

## 2017-03-19 DIAGNOSIS — R17 Unspecified jaundice: Secondary | ICD-10-CM

## 2017-03-19 DIAGNOSIS — C3491 Malignant neoplasm of unspecified part of right bronchus or lung: Secondary | ICD-10-CM

## 2017-03-19 DIAGNOSIS — C786 Secondary malignant neoplasm of retroperitoneum and peritoneum: Principal | ICD-10-CM

## 2017-03-19 DIAGNOSIS — K56691 Other complete intestinal obstruction: Secondary | ICD-10-CM

## 2017-03-19 DIAGNOSIS — Z7189 Other specified counseling: Secondary | ICD-10-CM

## 2017-03-19 DIAGNOSIS — R112 Nausea with vomiting, unspecified: Secondary | ICD-10-CM

## 2017-03-19 DIAGNOSIS — I82890 Acute embolism and thrombosis of other specified veins: Secondary | ICD-10-CM

## 2017-03-19 DIAGNOSIS — E43 Unspecified severe protein-calorie malnutrition: Secondary | ICD-10-CM

## 2017-03-19 DIAGNOSIS — R634 Abnormal weight loss: Secondary | ICD-10-CM

## 2017-03-19 DIAGNOSIS — R630 Anorexia: Secondary | ICD-10-CM

## 2017-03-19 DIAGNOSIS — C251 Malignant neoplasm of body of pancreas: Secondary | ICD-10-CM

## 2017-03-19 DIAGNOSIS — Z515 Encounter for palliative care: Secondary | ICD-10-CM

## 2017-03-19 LAB — COMPREHENSIVE METABOLIC PANEL
ALBUMIN: 2.9 g/dL — AB (ref 3.5–5.0)
ALK PHOS: 240 U/L — AB (ref 38–126)
ALT: 119 U/L — ABNORMAL HIGH (ref 17–63)
AST: 94 U/L — AB (ref 15–41)
Anion gap: 7 (ref 5–15)
BILIRUBIN TOTAL: 4.6 mg/dL — AB (ref 0.3–1.2)
BUN: 17 mg/dL (ref 6–20)
CALCIUM: 8.8 mg/dL — AB (ref 8.9–10.3)
CO2: 32 mmol/L (ref 22–32)
Chloride: 97 mmol/L — ABNORMAL LOW (ref 101–111)
Creatinine, Ser: 0.63 mg/dL (ref 0.61–1.24)
GFR calc Af Amer: >60 mL/min (ref >60)
GFR calc non Af Amer: >60 mL/min (ref >60)
GLUCOSE: 135 mg/dL — AB (ref 65–99)
Potassium: 3.6 mmol/L (ref 3.5–5.1)
Sodium: 136 mmol/L (ref 135–145)
TOTAL PROTEIN: 6.3 g/dL — AB (ref 6.5–8.1)

## 2017-03-19 MED ORDER — TRAZODONE HCL 100 MG PO TABS
100.0000 mg | ORAL_TABLET | Freq: Every evening | ORAL | Status: DC | PRN
  Administered 2017-03-19: 100 mg via ORAL
  Filled 2017-03-19: qty 1

## 2017-03-19 MED ORDER — OXYCODONE HCL 5 MG PO TABS
5.0000 mg | ORAL_TABLET | ORAL | Status: DC | PRN
  Administered 2017-03-19 (×2): 5 mg via ORAL
  Filled 2017-03-19 (×2): qty 1

## 2017-03-19 NOTE — Telephone Encounter (Signed)
Received call from [facility], new pt referral @ [facility] re:  Pt will be discharged from the hospital over weekend.  Pt and wife will be admitted to [facility] , and would like for Dr. Burr Medico to be hospice attending.  Dr. Burr Medico notified. Spoke with Dewaine Oats, and informed that Dr. Burr Medico will be the hospice attending ;  Hospice physicians to assist with symptoms management, and activate hospice standing orders.  Yvette voiced understanding.

## 2017-03-19 NOTE — Progress Notes (Signed)
PROGRESS NOTE  Fred Bradford FOY:[id number] DOB: March 19, 1950 DOA: 03/18/2017 PCP: Gildardo Pounds, NP  HPI/Recap of past 24 hours: Fred Bradford is a 67 y.o. male with PMH of newly diagnosed pancreatic cancer and lung cancer who now presents with a bowel obstruction prior to starting palliative chemotherapy. Recently diagnosed pancreatic cancer(02/15/2017) with concern for invasion of the splenic artery and vein. Confirmed with a biopsy during ERCP. Underwent MRCP 03/17/17 which revealed a new colonic obstruction at the distal transverse colon secondary to a soft tissue mass 4.7 cm x 3.1 cm. Underwent percutaneous placement to relieve biliary obstruction 03/18/17. Admitted to [facility] for further evaluation and management of bowel obstruction in the setting of non curable malignancy. Oncology recommends hospice. Palliative care team consulted met with patient and family.   Patient is now comfort care only.   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  Bowel obstruction due to mass metastasizing to the colon 4.7 x 3.1 cm  -likely from his advanced pancreatic cancer -not a candidate for chemotherapy -oncology recommends hospice -comfort care only  2.  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  Adenocarcinoma of the right lung:  -supportive care -maintain o2 sat 92% or greater for comfort  4.  Abnormal weight loss:  -Due to pancreatic cancer.  -Patient currently on Marinol for tight stimulation.  5.  Splenic vein thrombosis:  -Noted on MRCP -comfort care only  6.  Goals of care counseling discussion:  Palliative care consulted now hospice care also  consulted Plan is to discharge with hospice care   Code Status: DNR  Family Communication: no family member at bedside at time of this visit   Disposition Plan: will stay another midnight to continue IV fluid hydration and pain management   Consultants:  Oncology  palliative care team  Procedures:   percutaneous drain placement 03/18/17  Antimicrobials:  none  DVT prophylaxis:  SCDs   Objective: Vitals:   03/19/17 0506 03/19/17 0537 03/19/17 0553 03/19/17 1345  BP:  (!) 89/54 100/67 107/79  Pulse:  88 90 94  Resp:  16  16  Temp:  99.2 F (37.3 C)  98.4 F (36.9 C)  TempSrc:  Oral  Oral  SpO2:  98%  96%  Weight: 49.4 kg (108 lb 14.5 oz)     Height:        Intake/Output Summary (Last 24 hours) at 03/19/2017 1734 Last data filed at 03/19/2017 1428 Gross per 24 hour  Intake 1053.75 ml  Output 830 ml  Net 223.75 ml   Filed Weights   03/18/17 0727 03/19/17 0506  Weight: 47.2 kg (104 lb) 49.4 kg (108 lb 14.5 oz)    Exam:   General:  67 yo AAM cachectic NAD A&O x 3  Cardiovascular: RRR no rubs or gallops   Respiratory: CTA no rales or wheezes  Abdomen: soft NT ND NBS x4; drain present in right lateral abdomen with good output present  Musculoskeletal: cachectic non focal no edema   Skin: no rash or open lesions noted  Psychiatry: mood is appropriate for condition and setting    Data Reviewed: CBC: Recent Labs  Lab 03/15/17 1320 03/18/17  0816  WBC  --  9.3  NEUTROABS 4.5  --   HGB  --  11.3*  HCT 41.6 35.6*  MCV 85.8 86.4  PLT  --  102   Basic Metabolic Panel: Recent Labs  Lab 03/15/17 1320 03/18/17 0816 03/19/17 0630  NA 136 138 136  K 4.3 3.7 3.6  CL 98 97* 97*  CO2 29 34* 32  GLUCOSE 126 133* 135*  BUN 22 21* 17  CREATININE  --  0.65 0.63  CALCIUM 9.9 9.0 8.8*   GFR: Estimated Creatinine Clearance: 61.8 mL/min (by C-G formula based on SCr of 0.63 mg/dL). Liver Function Tests: Recent Labs  Lab 03/15/17 1320  03/18/17 0816 03/19/17 0630  AST 92* 141* 94*  ALT 146* 129* 119*  ALKPHOS 354* 296* 240*  BILITOT 7.9* 7.5* 4.6*  PROT 7.6 6.5 6.3*  ALBUMIN 3.6 3.1* 2.9*   No results for input(s): LIPASE, AMYLASE in the last 168 hours. No results for input(s): AMMONIA in the last 168 hours. Coagulation Profile: Recent Labs  Lab 03/16/17 0750 03/18/17 0816  INR 0.95 0.96   Cardiac Enzymes: No results for input(s): CKTOTAL, CKMB, CKMBINDEX, TROPONINI in the last 168 hours. BNP (last 3 results) No results for input(s): PROBNP in the last 8760 hours. HbA1C: No results for input(s): HGBA1C in the last 72 hours. CBG: Recent Labs  Lab 03/16/17 1250  GLUCAP 103*   Lipid Profile: No results for input(s): CHOL, HDL, LDLCALC, TRIG, CHOLHDL, LDLDIRECT in the last 72 hours. Thyroid Function Tests: No results for input(s): TSH, T4TOTAL, FREET4, T3FREE, THYROIDAB in the last 72 hours. Anemia Panel: No results for input(s): VITAMINB12, FOLATE, FERRITIN, TIBC, IRON, RETICCTPCT in the last 72 hours. Urine analysis:    Component Value Date/Time   COLORURINE AMBER (A) 02/26/2017 2109   APPEARANCEUR HAZY (A) 02/26/2017 2109   LABSPEC 1.023 02/26/2017 2109   PHURINE 6.0 02/26/2017 2109   GLUCOSEU NEGATIVE 02/26/2017 2109   HGBUR NEGATIVE 02/26/2017 2109   BILIRUBINUR NEGATIVE 02/26/2017 2109   [place] NEGATIVE 02/26/2017 2109   PROTEINUR NEGATIVE 02/26/2017 2109   NITRITE NEGATIVE 02/26/2017 2109   LEUKOCYTESUR NEGATIVE 02/26/2017 2109   Sepsis Labs: @LABRCNTIP (procalcitonin:4,lacticidven:4)  )No results found for this or any previous visit (from the past 240 hour(s)).    Studies: Acute Abdominal Series  Result Date: 03/19/2017 CLINICAL DATA:  Bowel obstruction EXAM: DG ABDOMEN ACUTE W/ 1V CHEST COMPARISON:  Chest x-ray 03/16/2017, abdominal MRI 03/17/2017. FINDINGS: Right Port-A-Cath remains in place, unchanged. Heart is normal size. No visible confluent airspace opacities. Previously  seen right perihilar nodule not as well visualized on today's study. No effusions. Dilated central bowel loops, likely transverse colon when compared to recent MRI. No organomegaly or free air. Percutaneous biliary catheter in place. IMPRESSION: Dilated transverse colon with air-fluid levels compatible with distal transverse colonic obstruction as seen on recent MRI. Electronically Signed   By: Rolm Baptise M.D.   On: 03/19/2017 09:31    Scheduled Meds: . dronabinol  2.5 mg Oral BID AC  . heparin  5,000 Units Subcutaneous Q8H  . lidocaine-prilocaine   Topical BID  . senna  1 tablet Oral BID  . sodium chloride flush  3 mL Intravenous Q12H  . sodium chloride flush  5 mL Intravenous Q8H  . traZODone  100 mg Oral QHS    Continuous Infusions: . sodium chloride    . sodium chloride    . dextrose 5 % and 0.9 % NaCl with KCl 20 mEq/L 75 mL/hr  at 03/19/17 0540     LOS: 1 day     Kayleen Memos, MD [facility] Pager [phone]  If 7PM-7AM, please contact night-coverage [email or web address] Password [facility] 03/19/2017, 5:34 PM

## 2017-03-19 NOTE — Progress Notes (Signed)
[facility] Liaison: RN visit  Notified by Magdalen Spatz, [facility] of patient/family request for [facility] services at home after discharge. Chart and patient information under review by [facility] physician. Hospice eligibility pending at this time.  Writer spoke with patient at bedside and wife by phone to initiate education related to hospice philosophy, services and team approach to care. They verbalized understanding of information given. Per discussion, plan is for discharge to home by private vehicle tomorrow (03/20/2017).  Please send signed and completed DNR form home with patient/family. Patient will need prescriptions for discharge comfort medications.  DME needs have been discussed, patient currently has the following equipment in the home: none. Patient/family requests the following DME for delivery to the home:hospital bed, OBT and 3N1 . [facility] equipment manager has been notified and will contact [place] to arrange delivery to the home. Home address has been verified and is correct in the chart. is the family member to contact to arrange time of delivery.  [facility] aware of the above. Please notify [facility] when patient is ready to leave the unit at discharge. (Call [phone] or [phone] after 5pm.) [facility] information and contact numbers given to at time of visit. Above information shared with CMRN.  Please call with any hospice related questions.  Thank you for this referral.  Farrel Gordon, RN, [facility] Liaison [phone] ? [facility] liaisons are on [place].

## 2017-03-19 NOTE — Progress Notes (Signed)
Fred Bradford   DOB:15-Mar-1950   CX#:[id number]   SHF#:[id number]  Oncology follow-up note   subjective: Patient came into [facility] for percutaneous biliary drainage by IR yesterday, and was subsequently admitted for observation due to the bowel obstruction.  He has had worsening nausea and vomiting, no bowel movement for the past 3-4 days.  No fever.  He is n.p.o., feels better now.  Objective:  Vitals:   03/19/17 0537 03/19/17 0553  BP: (!) 89/54 100/67  Pulse: 88 90  Resp: 16   Temp: 99.2 F (37.3 C)   SpO2: 98%     Body mass index is 16.08 kg/m.  Intake/Output Summary (Last 24 hours) at 03/19/2017 0840 Last data filed at 03/19/2017 0540 Gross per 24 hour  Intake 1048.75 ml  Output 630 ml  Net 418.75 ml     Sclerae unicteric  Oropharynx clear  No peripheral adenopathy  Lungs clear -- no rales or rhonchi  Heart regular rate and rhythm  Abdomen soft, biliary drainage bag in place with greenish fluids   MSK no focal spinal tenderness, no peripheral edema  Neuro nonfocal   CBG (last 3)  Recent Labs    03/16/17 1250  GLUCAP 103*     Labs:  Lab Results  Component Value Date   WBC 9.3 03/18/2017   HGB 11.3 (L) 03/18/2017   HCT 35.6 (L) 03/18/2017   MCV 86.4 03/18/2017   PLT 372 03/18/2017   NEUTROABS 4.5 03/15/2017   CMP Latest Ref Rng & Units 03/19/2017 03/18/2017 03/15/2017  Glucose 65 - 99 mg/dL 135(H) 133(H) 126  BUN 6 - 20 mg/dL 17 21(H) 22  Creatinine 0.61 - 1.24 mg/dL 0.63 0.65 -  Sodium 135 - 145 mmol/L 136 138 136  Potassium 3.5 - 5.1 mmol/L 3.6 3.7 4.3  Chloride 101 - 111 mmol/L 97(L) 97(L) 98  CO2 22 - 32 mmol/L 32 34(H) 29  Calcium 8.9 - 10.3 mg/dL 8.8(L) 9.0 9.9  Total Protein 6.5 - 8.1 g/dL 6.3(L) 6.5 7.6  Total Bilirubin 0.3 - 1.2 mg/dL 4.6(H) 7.5(H) 7.9(HH)  Alkaline Phos 38 - 126 U/L 240(H) 296(H) 354(H)  AST 15 - 41 U/L 94(H) 141(H) 92(H)  ALT 17 - 63 U/L 119(H) 129(H) 146(H)    Urine Studies No results for input(s): UHGB, CRYS in the  last 72 hours.  Invalid input(s): UACOL, UAPR, USPG, UPH, UTP, UGL, UKET, UBIL, UNIT, UROB, ULEU, UEPI, UWBC, URBC, UBAC, CAST, [place], [place]  Basic Metabolic Panel: Recent Labs  Lab 03/15/17 1320 03/18/17 0816 03/19/17 0630  NA 136 138 136  K 4.3 3.7 3.6  CL 98 97* 97*  CO2 29 34* 32  GLUCOSE 126 133* 135*  BUN 22 21* 17  CREATININE  --  0.65 0.63  CALCIUM 9.9 9.0 8.8*   GFR Estimated Creatinine Clearance: 61.8 mL/min (by C-G formula based on SCr of 0.63 mg/dL). Liver Function Tests: Recent Labs  Lab 03/15/17 1320 03/18/17 0816 03/19/17 0630  AST 92* 141* 94*  ALT 146* 129* 119*  ALKPHOS 354* 296* 240*  BILITOT 7.9* 7.5* 4.6*  PROT 7.6 6.5 6.3*  ALBUMIN 3.6 3.1* 2.9*   No results for input(s): LIPASE, AMYLASE in the last 168 hours. No results for input(s): AMMONIA in the last 168 hours. Coagulation profile Recent Labs  Lab 03/16/17 0750 03/18/17 0816  INR 0.95 0.96    CBC: Recent Labs  Lab 03/15/17 1320 03/18/17 0816  WBC  --  9.3  NEUTROABS 4.5  --   HGB  --  11.3*  HCT 41.6 35.6*  MCV 85.8 86.4  PLT  --  372   Cardiac Enzymes: No results for input(s): CKTOTAL, CKMB, CKMBINDEX, TROPONINI in the last 168 hours. BNP: Invalid input(s): POCBNP CBG: Recent Labs  Lab 03/16/17 1250  GLUCAP 103*   D-Dimer No results for input(s): DDIMER in the last 72 hours. Hgb A1c No results for input(s): HGBA1C in the last 72 hours. Lipid Profile No results for input(s): CHOL, HDL, LDLCALC, TRIG, CHOLHDL, LDLDIRECT in the last 72 hours. Thyroid function studies No results for input(s): TSH, T4TOTAL, T3FREE, THYROIDAB in the last 72 hours.  Invalid input(s): FREET3 Anemia work up No results for input(s): VITAMINB12, FOLATE, FERRITIN, TIBC, IRON, RETICCTPCT in the last 72 hours. Microbiology No results found for this or any previous visit (from the past 240 hour(s)).    Studies:  Mr 3d Recon At Scanner  Result Date: 03/18/2017 CLINICAL DATA:  Rising  abnormal liver function tests. Pancreatic carcinoma. EXAM: MRI ABDOMEN WITHOUT AND WITH CONTRAST (INCLUDING MRCP) TECHNIQUE: Multiplanar multisequence MR imaging of the abdomen was performed both before and after the administration of intravenous contrast. Heavily T2-weighted images of the biliary and pancreatic ducts were obtained, and three-dimensional MRCP images were rendered by post processing. CONTRAST:  37mL MULTIHANCE GADOBENATE DIMEGLUMINE 529 MG/ML IV SOLN COMPARISON:  02/15/2017 FINDINGS: Lower chest: No acute findings. Hepatobiliary: No hepatic masses identified. Intrahepatic biliary ductal dilatation in the left hepatic lobe shows no significant change, however there is increased intrahepatic dilatation seen in the right lobe since prior study. Gallbladder contains T1 hyperintense sludge, but no evidence of dilatation or pericholecystic inflammatory changes. Pancreas: Peripherally enhancing mass with central necrosis is again seen in the pancreatic body measuring 4.2 x 2.9 cm on image 43/1103, which is not significant change since previous study. Atrophy and ductal dilatation is again seen in the tail. 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 Spleen:  Within normal limits in size and appearance. Adrenals/Urinary Tract: No masses identified. No evidence of hydronephrosis. Stomach/Bowel: Marked dilatation of the ascending and transverse colon is seen which is a new finding. There is a enhancing mass at the transition point in the distal transverse colon which measures 4.7 x 3.6 cm on image 49/1104, suspicious for metastasis, with primary colon carcinoma considered less likely. Vascular/Lymphatic: Lymphadenopathy seen in the peripancreatic region and extending in the porta hepatis which in cases and narrows the proximal main portal vein and involves the liver hilum. In the liver hilum, this measures 4.2 x 3.3 cm on image 36/1103, compared to 3.1 x 2.6 cm  previously. This causes obstruction of the common hepatic duct. No evidence of abdominal aortic aneurysm. Other:  Mild ascites, without significant change. Musculoskeletal:  No suspicious bone lesions identified. IMPRESSION: New colonic obstruction at the distal transverse colon due to 4.7 cm soft tissue mass. This is suspicious for metastatic disease, with primary colon carcinoma considered less likely. No significant change in 4 cm mass in the pancreatic body, consistent with primary pancreatic carcinoma. Again seen is encasement of the celiac axis and splenic vein thrombosis. Peripancreatic and porta hepatis lymphadenopathy. Porta hepatis lymphadenopathy is increased since previous study, causing obstruction of the common hepatic duct and new dilatation of intrahepatic bile ducts in right lobe. Mild ascites, without significant change. Electronically Signed   By: Earle Gell M.D.   On: 03/18/2017 08:53   Mr Abdomen Mrcp Moise Boring Contast  Result Date: 03/18/2017 CLINICAL DATA:  Rising abnormal liver  function tests. Pancreatic carcinoma. EXAM: MRI ABDOMEN WITHOUT AND WITH CONTRAST (INCLUDING MRCP) TECHNIQUE: Multiplanar multisequence MR imaging of the abdomen was performed both before and after the administration of intravenous contrast. Heavily T2-weighted images of the biliary and pancreatic ducts were obtained, and three-dimensional MRCP images were rendered by post processing. CONTRAST:  61mL MULTIHANCE GADOBENATE DIMEGLUMINE 529 MG/ML IV SOLN COMPARISON:  02/15/2017 FINDINGS: Lower chest: No acute findings. Hepatobiliary: No hepatic masses identified. Intrahepatic biliary ductal dilatation in the left hepatic lobe shows no significant change, however there is increased intrahepatic dilatation seen in the right lobe since prior study. Gallbladder contains T1 hyperintense sludge, but no evidence of dilatation or pericholecystic inflammatory changes. Pancreas: Peripherally enhancing mass with central necrosis is  again seen in the pancreatic body measuring 4.2 x 2.9 cm on image 43/1103, which is not significant change since previous study. Atrophy and ductal dilatation is again seen in the tail. 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 Spleen:  Within normal limits in size and appearance. Adrenals/Urinary Tract: No masses identified. No evidence of hydronephrosis. Stomach/Bowel: Marked dilatation of the ascending and transverse colon is seen which is a new finding. There is a enhancing mass at the transition point in the distal transverse colon which measures 4.7 x 3.6 cm on image 49/1104, suspicious for metastasis, with primary colon carcinoma considered less likely. Vascular/Lymphatic: Lymphadenopathy seen in the peripancreatic region and extending in the porta hepatis which in cases and narrows the proximal main portal vein and involves the liver hilum. In the liver hilum, this measures 4.2 x 3.3 cm on image 36/1103, compared to 3.1 x 2.6 cm previously. This causes obstruction of the common hepatic duct. No evidence of abdominal aortic aneurysm. Other:  Mild ascites, without significant change. Musculoskeletal:  No suspicious bone lesions identified. IMPRESSION: New colonic obstruction at the distal transverse colon due to 4.7 cm soft tissue mass. This is suspicious for metastatic disease, with primary colon carcinoma considered less likely. No significant change in 4 cm mass in the pancreatic body, consistent with primary pancreatic carcinoma. Again seen is encasement of the celiac axis and splenic vein thrombosis. Peripancreatic and porta hepatis lymphadenopathy. Porta hepatis lymphadenopathy is increased since previous study, causing obstruction of the common hepatic duct and new dilatation of intrahepatic bile ducts in right lobe. Mild ascites, without significant change. Electronically Signed   By: Earle Gell M.D.   On: 03/18/2017 08:53   Ir Int Lianne Cure Biliary Drain With  Cholangiogram  Result Date: 03/18/2017 INDICATION: 67 year old male with pancreatic cancer and obstructed jaundice. He presents for percutaneous transhepatic cholangiography and biliary drain placement. EXAM: Percutaneous transhepatic cholangiography and placement of an internal/external percutaneous biliary drain. MEDICATIONS: 3.375 g Zosyn; The antibiotic was administered within an appropriate time frame prior to the initiation of the procedure. ANESTHESIA/SEDATION: Moderate (conscious) sedation was employed during this procedure. A total of Versed 1 mg and Fentanyl 100 mcg was administered intravenously. Moderate Sedation Time: 25 minutes. The patient's level of consciousness and vital signs were monitored continuously by radiology nursing throughout the procedure under my direct supervision. FLUOROSCOPY TIME:  Fluoroscopy Time: 7 minutes 30 seconds (42 mGy). COMPLICATIONS: None immediate. PROCEDURE: Informed written consent was obtained from the patient after a thorough discussion of the procedural risks, benefits and alternatives. All questions were addressed. Maximal Sterile Barrier Technique was utilized including caps, mask, sterile gowns, sterile gloves, sterile drape, hand hygiene and skin antiseptic. A timeout was performed prior to the initiation of  the procedure. Under fluoroscopic guidance, a Chiba catheter was advanced intercostal at the anterior axillary line into the liver. As the needle was pulled back, a gentle hand injection of contrast material was performed. There was near immediate opacification of the biliary tree. A percutaneous transhepatic cholangiogram was performed. 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 There is minimal contrast entering the left biliary system. A suitable tertiary radical from the right posterior ductal system was identified as an access point. A 21 gauge Accustick needle was  then carefully used to puncture the tertiary radicle. A wire was advanced into the central right hepatic ducts. The needle was exchanged over the wire for the Accustick sheath. The Accustick sheath was advanced into the proximal common hepatic duct just above the occlusion. A road runner wire was successfully navigated across the occlusion and into the distal common bile duct. The Accustick sheath was advanced into the distal common bile duct. Contrast injection confirms patency of the ampulla. The roadrunner wire was advanced through the ampulla and into the duodenum. A 4 French glide cath was advanced over the wire into the duodenum. The roadrunner wire was then exchanged for a superstiff Amplatz wire. The glide catheter and Accustick sheath were removed. The skin tract was dilated to 10 [place]. A Cook 10.2 [place] biliary drainage catheter was then advanced over the wire and formed within the duodenum. The catheter was flushed. Excellent patency. There is already decompression of the biliary tree. The catheter was then flushed with saline and secured to the skin with 0 Prolene suture. A gravity bag was placed.  The patient tolerated the procedure well. IMPRESSION: 1. 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 Minimal contrast enters the left biliary tree from the right. 2. Successful placement of a 10 French internal/external percutaneous biliary drainage catheter. Signed, Criselda Peaches, MD [facility] Electronically Signed   By: Jacqulynn Cadet M.D.   On: 03/18/2017 12:49    Assessment: 67 y.o. 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  Colon obstruction at the transverse colon, probably secondary to peritoneum  metastasis 2.  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  Right upper lobe lung adenocarcinoma, newly diagnosed, untreated 4.  Severe protein and calorie malnutrition 5.  Nausea, vomiting, anorexia, secondary to underlying malignancy and bowel obstruction  Plan:  -Unfortunately during the preparation to start him on palliative chemotherapy for his pancreatic cancer, he has developed obstructive jaundice and bowel obstruction.  He is status post percutaneous biliary drainage -Both patient and his wife has clearly stated that he is not interested in diverting colostomy -I have spoke with [facility] PA Sarah who saw pt yesterday and has discussed with Dr. Henrene Pastor who does not think he can offer colonic stenting due to the location  -Unfortunately with the bowel obstruction, his nutrition intake will be severely compromised, he will not be a candidate for palliative chemotherapy. -Patient understand his cancer is not curable, and the goal of therapy is palliative -I recommend him to consider palliative care alone and hospice, palliative care consult was called yesterday, patient and wife wants to talk to them, and I left a message for them  -I will f/u, please call me if  there is anything I can help with    Truitt Merle, MD 03/19/2017  8:40 AM

## 2017-03-19 NOTE — Progress Notes (Signed)
PA to patient's room, however in discussion with wife and Palliative team.  Spoke with RN who states drain is working well.  Some bleeding from insertion site after bed transfer for imaging this AM which has resolved.  She reports no further issues. 630 mL output recorded yesterday.   Will monitor for discharge plans and goals of care re: drain.   Brynda Greathouse, MS RD PA-C 10:38 AM

## 2017-03-19 NOTE — Consult Note (Addendum)
Consultation Note Date: 03/19/2017   Patient Name: Fred Bradford  DOB: June 21, 1950  MRN: [id number]  Age / Sex: 67 y.o., male  PCP: Gildardo Pounds, NP Referring Physician: Kayleen Memos, DO  Reason for Consultation: Establishing goals of care  HPI/Patient Profile: 67 y.o. male  admitted on 03/18/2017   66 y.o. 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 Henrene Pastor who does not think he can offer colonic stenting due to the location. Patient has been seen by oncology, no longer a a candidate for palliative chemotherapy.   PMT consulted for additional goals of care discussions.    Clinical Assessment and Goals of Care:  Mr Saputo is a pleasant gentleman resting in bed, his wife is at the bedside. I introduced myself and palliative care as follows: Palliative medicine is specialized medical care for people living with serious illness. It focuses on providing relief from the symptoms and stress of a serious illness. The goal is to improve quality of life for both the patient and the family.  Mr Lindblad's wife is the primary historian, he didn't like going to doctors, he has always been healthy. He was diagnosed with pancreatic cancer in November 2018.   Mr Kant states that he has had discussions with his wife, he wants to be home, he wants to be kept as comfortable as possible, he wants to continue with comfort feeds, what ever he can tolerate taking, such as jello, sips of liquids etc. He wants to avoid hospitalizations, he wants to stay in his own surroundings.   Hospice philosophy of care discussed in detail, home with hospice arrangements discussed in detail. Patient does not desire any further surgeries or  procedures, does not want artificial nutrition.   See recommendations below, thank you for the consult.   HCPOA Wife of 40+ years, they have a niece who lives with them.  Patient and wife each have adult kids from previous relationships, who they say are grown and out of the house.    SUMMARY OF RECOMMENDATIONS   1.  DNR DNI 2. Home with hospice on discharge, case management consulted, choices discussed, patient lives in [place], [place] with wife, elects [facility].  3. Patient and wife elect no further procedures, goal is to focus on comfort, with the patient being at home.  4. Comfort feeds only, patient wants to continue with jello and juice, sips and bites only, patient and wife do not want artificial nutrition.  5. Prognosis could be as short as few weeks, frankly and compassionately discussed with patient and wife.  6. Symptom management: recommend Oxycodone IR PO PRN 5 mg Q 3 hours on discharge, along with bowel and anti emetic regimen, along with TUMS and compazine. Ok to continue his trazodone on discharge.    Code Status/Advance Care Planning:  DNR    Symptom Management:    as above  Palliative Prophylaxis:   Delirium Protocol  Additional Recommendations (Limitations, Scope, Preferences):  Full Comfort Care  Psycho-social/Spiritual:   Desire for further Chaplaincy support:yes  Additional Recommendations: Education on Hospice  Prognosis:   < 4 weeks  Discharge Planning: Home with Hospice      Primary Diagnoses: Present on Admission: . Bowel obstruction ([place]) . Adenocarcinoma of lung, right ([place]) . Weight loss, abnormal . Malignant neoplasm of body of pancreas ([place]) . Splenic vein thrombosis   I have reviewed the medical record, interviewed the patient and family, and examined the patient. The following aspects are pertinent.  Past Medical History:  Diagnosis Date  . Adenocarcinoma of lung, right ([place]) 03/15/2017  . Jaundice 03/2017  . Lesion of colon  03/18/2017  . Lung nodules 03/2017  . Pancreatic cancer [facility])    Social History   Socioeconomic History  . Marital status: Single    Spouse name: None  . Number of children: None  . Years of education: None  . Highest education level: None  Social Needs  . Financial resource strain: None  . Food insecurity - worry: None  . Food insecurity - inability: None  . Transportation needs - medical: None  . Transportation needs - non-medical: None  Occupational History  . None  Tobacco Use  . Smoking status: Former Smoker    Packs/day: 1.00    Years: 40.00    Pack years: 40.00    Types: Cigarettes    Last attempt to quit: 2017    Years since quitting: 2.0  . Smokeless tobacco: Never Used  Substance and Sexual Activity  . Alcohol use: No    Frequency: Never    Comment: 1 beer daily, has not consumed lately   . Drug use: No  . Sexual activity: None  Other Topics Concern  . None  Social History Narrative  . None   Family History  Problem Relation Age of Onset  . Cancer Mother        pancreas  . Cancer Father        prostate   Scheduled Meds: . dronabinol  2.5 mg Oral BID AC  . heparin  5,000 Units Subcutaneous Q8H  . lidocaine-prilocaine   Topical BID  . senna  1 tablet Oral BID  . sodium chloride flush  3 mL Intravenous Q12H  . sodium chloride flush  5 mL Intravenous Q8H  . traZODone  100 mg Oral QHS   Continuous Infusions: . sodium chloride    . sodium chloride    . dextrose 5 % and 0.9 % NaCl with KCl 20 mEq/L 75 mL/hr at 03/19/17 0540   PRN Meds:.sodium chloride, calcium carbonate, HYDROcodone-acetaminophen, ketorolac, magnesium citrate, ondansetron **OR** ondansetron (ZOFRAN) IV, oxyCODONE-acetaminophen, prochlorperazine, simethicone, sodium chloride flush, traZODone Medications Prior to Admission:  Prior to Admission medications   Medication Sig Start Date End Date Taking? Authorizing Provider  calcium carbonate (TUMS - DOSED IN MG ELEMENTAL CALCIUM) 500 MG  chewable tablet Chew 1 tablet by mouth daily.   Yes [provider]  senna (SENOKOT) 8.6 MG TABS tablet Take 1 tablet (8.6 mg total) by mouth 2 (two) times daily. 02/17/17  Yes Enid Derry, [place], DO  dronabinol (MARINOL) 2.5 MG capsule Take 1 capsule (2.5 mg total) by mouth 2 (two) times daily before a meal. 03/15/17   Alla Feeling, NP  enoxaparin (LOVENOX) 80 MG/0.8ML injection Inject 0.8 mLs (80 mg total) into the skin daily. 02/23/17   Alla Feeling, NP  lidocaine-prilocaine (EMLA)  cream Apply to affected area once 03/08/17   Truitt Merle, MD  magnesium citrate SOLN Take 1 Bottle by mouth once as needed for moderate constipation.    [provider]  ondansetron (ZOFRAN) 8 MG tablet Take 1 tablet (8 mg total) by mouth 2 (two) times daily as needed (Nausea or vomiting). 03/08/17   Truitt Merle, MD  oxyCODONE (OXY IR/ROXICODONE) 5 MG immediate release tablet Take 1 tablet (5 mg total) by mouth every 4 (four) hours as needed for severe pain. 03/08/17   Truitt Merle, MD  oxyCODONE-acetaminophen (PERCOCET/ROXICET) 5-325 MG tablet Take 1-2 tablets by mouth every 6 (six) hours as needed for severe pain. 02/19/17   Verner Mould, MD  prochlorperazine (COMPAZINE) 10 MG tablet Take 1 tablet (10 mg total) by mouth every 6 (six) hours as needed (Nausea or vomiting). 03/08/17   Truitt Merle, MD  simethicone (MYLICON) 80 MG chewable tablet Chew 80 mg by mouth every 6 (six) hours as needed for flatulence.    [provider]  traZODone (DESYREL) 100 MG tablet Take 1 tablet (100 mg total) by mouth at bedtime. 02/26/17   Gildardo Pounds, NP   No Known Allergies Review of Systems Denies nausea Denies pain this am  Physical Exam              Sclerae unicteric             Oropharynx clear             No peripheral adenopathy             Lungs clear -- no rales or rhonchi             Heart regular rate and rhythm             Abdomen soft, biliary drainage bag in place with greenish  fluids              MSK no focal spinal tenderness, no peripheral edema             Neuro nonfocal    Vital Signs: BP 100/67   Pulse 90   Temp 99.2 F (37.3 C) (Oral)   Resp 16   Ht 5\' 9"  (1.753 m)   Wt 49.4 kg (108 lb 14.5 oz)   SpO2 98%   BMI 16.08 kg/m  Pain Assessment: 0-10   Pain Score: Asleep   SpO2: SpO2: 98 % O2 Device:SpO2: 98 % O2 Flow Rate: .   IO: Intake/output summary:   Intake/Output Summary (Last 24 hours) at 03/19/2017 1054 Last data filed at 03/19/2017 0540 Gross per 24 hour  Intake 1048.75 ml  Output 630 ml  Net 418.75 ml    LBM: Last BM Date: 03/17/17 Baseline Weight: Weight: 47.2 kg (104 lb) Most recent weight: Weight: 49.4 kg (108 lb 14.5 oz)     Palliative Assessment/Data:   Flowsheet Rows     Most Recent Value  Intake Tab  Referral Department  Hospitalist  Unit at Time of Referral  Med/Surg Unit  Palliative Care Primary Diagnosis  Cancer  Palliative Care Type  New Palliative care  Reason for referral  Clarify Goals of Care  Date first seen by Palliative Care  03/19/17  Clinical Assessment  Palliative Performance Scale Score  30%  Pain Max last 24 hours  3  Pain Min Last 24 hours  2  Dyspnea Max Last 24 Hours  3  Dyspnea Min Last 24 hours  2  Nausea Max Last  24 Hours  4  Nausea Min Last 24 Hours  3  Psychosocial & Spiritual Assessment  Palliative Care Outcomes  Patient/Family meeting held?  Yes  Who was at the meeting?  patient and his wife   Palliative Care Outcomes  Clarified goals of care      Time In:  9.30 Time Out:  10.40 Time Total:  70 min  Greater than 50%  of this time was spent counseling and coordinating care related to the above assessment and plan.  Signed by: Loistine Chance, MD  [phone]  Please contact Palliative Medicine Team phone at [phone] for questions and concerns.  For individual provider: See Shea Evans

## 2017-03-19 NOTE — Care Management Note (Signed)
Case Management Note  Patient Details  Name: [name] MRN: [id number] Date of Birth: 09-04-1950  Subjective/Objective:                    Action/Plan:  Consult for home with hospice . Spoke with patient at bedside offered choice , [facility]. Wife has left to run errands, patient states she is in agreement, pt consented for NCM to call wife. Called [name] at [phone] and left message.   Patient will need hospital bed and 3 in 1 , hospice agency will order same.   Called [facility] and gave referral for home with hospice expected discharge date 03-20-17. Expected Discharge Date:                  Expected Discharge Plan:  Home w Hospice Care  In-House Referral:     Discharge planning Services  CM Consult  Post Acute Care Choice:  Durable Medical Equipment Choice offered to:  Patient  DME Arranged:    DME Agency:     HH Arranged:    [place] Agency:     Status of Service:  In process, will continue to follow  If discussed at Long Length of Stay Meetings, dates discussed:    Additional Comments:  Marilu Favre, RN 03/19/2017, 12:13 PM

## 2017-03-19 NOTE — Progress Notes (Signed)
Nutrition Brief Note  Chart reviewed. Pt now transitioning to comfort care.  No further nutrition interventions warranted at this time.  Please re-consult as needed.   Ricahrd Schwager A. Scherrie [place], RD, LDN, CDE Pager: [phone] After hours Pager: [phone]  

## 2017-03-20 MED ORDER — HEPARIN SOD (PORK) LOCK FLUSH 100 UNIT/ML IV SOLN
500.0000 [IU] | INTRAVENOUS | Status: AC | PRN
  Administered 2017-03-20: 500 [IU]

## 2017-03-20 NOTE — Progress Notes (Signed)
Porta cath flushed by IV team prior to discharged

## 2017-03-20 NOTE — Progress Notes (Signed)
Discharged today with his wife. Seen by dr. [place] Crane this morning. Personal belongings, discharged instructions given to patient and wife. Advised to call [facility], [facility] and MD  for any questions ACE inhibitor therapy was not prescribed due to  Further questions asked.

## 2017-03-20 NOTE — Discharge Summary (Signed)
Discharge Summary  Fred Bradford ZES:[id number] DOB: 17-Oct-1950  PCP: Gildardo Pounds, NP  Admit date: 03/18/2017 Discharge date: 03/20/2017  Time spent: 25 minutes  Recommendations for Outpatient Follow-up:  1. Follow up with Hospice Care after discharge 2. Follow up with oncology after discharge if needed  3. Comfort care only per patient and wife request MRI abd w/wo contrast 03/17/17:IMPRESSION: New colonic obstruction at the distal transverse colon due to 4.7 cm soft tissue mass. This is suspicious for metastatic disease, with primary colon carcinoma considered less likely.  No significant change in 4 cm mass in the pancreatic body, consistent with primary pancreatic carcinoma. Again seen is encasement of the celiac axis and splenic vein thrombosis.  Peripancreatic and porta hepatis lymphadenopathy. Porta hepatis lymphadenopathy is increased since previous study, causing obstruction of the common hepatic duct and new dilatation of intrahepatic bile ducts in right lobe.  Mild ascites, without significant change.   Discharge Diagnoses:  Active Hospital Problems   Diagnosis Date Noted  . Bowel obstruction ([place]) 03/18/2017  . Encounter for palliative care   . 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 Adenocarcinoma of lung, right ([place]) 03/15/2017  . Weight loss, abnormal 03/15/2017  . Goals of care, counseling/discussion 03/08/2017  . Malignant neoplasm of body of pancreas [facility])     Resolved Hospital Problems  No resolved problems to display.    Discharge Condition: stable  Diet recommendation: Clear liquid diet as tolerated   Vitals:   03/19/17 2108 03/20/17 0554  BP: 105/63 114/78  Pulse: 80 82  Resp: 16 15  Temp: 98.4 F (36.9 C) 98.5 F (36.9 C)  SpO2: 99% 99%    History of present illness:  Fred Bradford a 67 y.o.malewith PMH of newly diagnosed pancreatic cancer and lung cancer who now presents with a bowel obstruction prior  to starting palliative chemotherapy. Recently diagnosedpancreatic cancer(02/15/2017)with concern for invasion of the splenic artery and vein. Confirmed with a biopsy during ERCP. Underwent MRCP 03/17/17 which revealed a new colonic obstruction at the distal transverse colon secondary to a soft tissue mass4.7 cm x 3.1 cm. Underwent percutaneous placement to relieve biliary obstruction1/10/19. Admitted to [facility] for further evaluation and management of bowel obstruction in the setting of non curable malignancy. Oncology recommends hospice. Palliative care team consulted met with patient and family.   Patient is now comfort care only.   No acute events overnight. Patient is being discharged to home with Hospice care. The writer spoke with the patient's wife who confirms that she has all the equipments at home to accommodate his needs.  On the day of discharge the patient was hemodynamically stable. Requested clear liquid diet which was started.  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 Bowel obstruction due to mass metastasizing to the colon 4.7 x 3.1 cm -likely from his advanced pancreatic cancer -not a candidate for palliative chemotherapy per oncology -oncology recommends hospice -comfort care only  2. 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 Adenocarcinoma of the right lung: -supportive care  4. Abnormal weight loss: -Due to pancreatic cancer.  -Patient currently on Marinol for appetitie stimulation.  5. Splenic vein  thrombosis: -Noted on MRCP -comfort care only  6. Goals of care counseling discussion: Palliative and hospice care  consulted Plan is to discharge with home with hospice care  Consultants:  Oncology  palliative care team  Hospice care team  Procedures:   percutaneous drain placement 03/18/17  Antimicrobials:  none   Discharge Exam: BP 114/78 (BP Location: Right Arm)   Pulse 82   Temp 98.5 F (36.9 C) (Oral)   Resp 15   Ht _0  (1.753 m)   Wt 51.9 kg (114 lb 6.7 oz)   SpO2 99%   BMI 16.90 kg/m    General:  67 yo AAM cachectic NAD A&O x 3 NAD A&O x 3  Cardiovascular: RRR no rubs or gallops   Respiratory: CTA no rales or wheezes  Abdomen: soft NT ND NBS x4; drain present in right lateral abdomen with good output present  Musculoskeletal: cachectic non focal no edema   Skin: no rash or open lesions noted  Psychiatry: mood is appropriate for condition and setting    Discharge Instructions You were cared for by a hospitalist during your hospital stay. If you have any questions about your discharge medications or the care you received while you were in the hospital after you are discharged, you can call the unit and asked to speak with the hospitalist on call if the hospitalist that took care of you is not available. Once you are discharged, your primary care physician will handle any further medical issues. Please note that NO REFILLS for any discharge medications will be authorized once you are discharged, as it is imperative that you return to your primary care physician (or establish a relationship with a primary care physician if you do not have one) for your aftercare needs so that they can reassess your need for medications and monitor your lab values.   Allergies as of 03/20/2017   No Known Allergies     Medication List    STOP taking these medications   calcium carbonate 500 MG chewable tablet Commonly known as:  TUMS - dosed in mg elemental calcium   dronabinol 2.5 MG capsule Commonly known as:  MARINOL   magnesium citrate Soln   oxyCODONE 5 MG immediate release  tablet Commonly known as:  Oxy IR/ROXICODONE   senna 8.6 MG Tabs tablet Commonly known as:  SENOKOT   simethicone 80 MG chewable tablet Commonly known as:  MYLICON     TAKE these medications   enoxaparin 80 MG/0.8ML injection Commonly known as:  LOVENOX Inject 0.8 mLs (80 mg total) into the skin daily.   lidocaine-prilocaine cream Commonly known as:  EMLA Apply to affected area once   ondansetron 8 MG tablet Commonly known as:  ZOFRAN Take 1 tablet (8 mg total) by mouth 2 (two) times daily as needed (Nausea or vomiting).   oxyCODONE-acetaminophen 5-325 MG tablet Commonly known as:  PERCOCET/ROXICET Take 1-2 tablets by mouth every 6 (six) hours as needed for severe pain.   prochlorperazine 10 MG tablet Commonly known as:  COMPAZINE Take 1 tablet (10 mg total) by mouth every 6 (six) hours as needed (Nausea or vomiting).   traZODone 100 MG tablet Commonly known as:  DESYREL Take 1 tablet (100 mg total) by mouth at bedtime.      No Known Allergies    The results of significant diagnostics from this hospitalization (including imaging, microbiology, ancillary and laboratory) are listed below for reference.    Significant Diagnostic Studies: Dg Chest 1 View  Result Date: 03/11/2017  CLINICAL DATA:  Status post lung biopsy EXAM: CHEST 1 VIEW COMPARISON:  CT biopsy films from earlier in the same day. FINDINGS: Cardiac shadow is within normal limits. The lungs are well aerated bilaterally. Right upper lobe lung nodule is again noted with changes of recent biopsy. No definitive pneumothorax is seen. IMPRESSION: No pneumothorax following lung biopsy. Electronically Signed   By: Inez Catalina M.D.   On: 03/11/2017 14:55   Ct Chest W Contrast  Result Date: 02/22/2017 CLINICAL DATA:  Newly diagnosed pancreatic cancer, staging assessment. Weight loss. EXAM: CT CHEST WITH CONTRAST TECHNIQUE: Multidetector CT imaging of the chest was performed during intravenous contrast administration.  CONTRAST:  14m ISOVUE-300 IOPAMIDOL (ISOVUE-300) INJECTION 61% COMPARISON:  CT and MRI examinations from 02/15/2017 FINDINGS: Cardiovascular: Atherosclerotic calcification of the aortic arch. Mediastinum/Nodes: No pathologic mediastinal adenopathy. Lungs/Pleura: Solid 1.3 by 1.1 cm right upper lobe nodule with mildly spiculated borders, image 82/3. Part solid right middle lobe pulmonary nodule measuring 1.0 by 0.9 cm on image 106/3. Sub solid right upper lobe pulmonary nodule 1.3 by 1.9 cm on image 60/3. Sub solid left upper lobe pulmonary nodule measuring 3.0 by 1.8 cm on image 32/3. This has a confluent more solid portion inferiorly measuring 1.0 by 0.7 cm, image 36/3. 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 Some of these are calcified. No pleural effusion. Upper Abdomen: Pancreatic mass with dilated dorsal pancreatic duct in the pancreatic tail, as noted on recent prior dedicated abdominal imaging exams. Gastric varices reconstitute the splenic vein. Left hepatic duct obstruction due to adenopathy in the porta hepatis. Indistinct fat planes in the porta hepatis along with periportal edema. Indistinct potential adenopathy in the right gastric chain. Musculoskeletal: No findings of osseous metastatic disease. We partially image the known deformity at the L2 vertebral level with bridging spurring at L1-2. IMPRESSION: 1. Bilateral subsolid and partially solid nodules in addition to a solid 1.3 by 1.1 cm right upper lobe nodule, appearance suspicious for malignancy. Given the spiculated and subsolid appearance of some of these nodules, I cannot exclude a second primary lung malignancy in addition to the known pancreatic lesion. 2. Known pancreatic mass with stable findings in the upper abdomen. 3. 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 4.  Aortic Atherosclerosis (ICD10-I70.0). Electronically Signed   By: WVan ClinesM.D.   On:  02/22/2017 12:31   [place]oreaAbdomen Complete  Result Date: 03/16/2017 CLINICAL DATA:  67year old male with pancreatic cancer and jaundice. Initial encounter. EXAM: ABDOMEN ULTRASOUND COMPLETE COMPARISON:  03/05/2017. FINDINGS: Gallbladder: Echogenic material within the gallbladder with mild gallbladder wall thickening measuring up to 3.2 mm. On prior PET-CT, radiopaque material seen within the gallbladder which may represent stones or sludge. Patient was not tender over the gallbladder during scanning per ultrasound technologist. Common bile duct: Diameter: 6.9 mm. Liver: Intrahepatic biliary duct prominence. Suspect mild periportal edema. Portal vein is patent on color Doppler imaging with normal direction of blood flow towards the liver. IVC: No abnormality visualized. Pancreas: Heterogeneous pancreatic head mass. Spleen: Size and appearance within normal limits. Right Kidney: Length: 10 cm. Echogenicity within normal limits. No mass or hydronephrosis visualized. Left Kidney: Length: 10.1 cm. Echogenicity within normal limits. No mass or hydronephrosis visualized. Abdominal aorta: Atherosclerotic changes with slight ectasia without focal aneurysm with maximal AP dimension 2.6 cm. Other findings: None. IMPRESSION: Pancreatic head mass with subsequent dilated common bile duct and mild intrahepatic biliary duct dilation. Mild periportal edema also suspected. Gallbladder sludge/stones with mild gallbladder  wall thickening without tenderness over the gallbladder during scanning. Atherosclerotic changes aorta without focal aneurysm. Electronically Signed   By: Genia Del M.D.   On: 03/16/2017 10:37   Mr 3d Recon At Scanner  Result Date: 03/18/2017 CLINICAL DATA:  Rising abnormal liver function tests. Pancreatic carcinoma. EXAM: MRI ABDOMEN WITHOUT AND WITH CONTRAST (INCLUDING MRCP) TECHNIQUE: Multiplanar multisequence MR imaging of the abdomen was performed both before and after the administration of intravenous  contrast. Heavily T2-weighted images of the biliary and pancreatic ducts were obtained, and three-dimensional MRCP images were rendered by post processing. CONTRAST:  34m MULTIHANCE GADOBENATE DIMEGLUMINE 529 MG/ML IV SOLN COMPARISON:  02/15/2017 FINDINGS: Lower chest: No acute findings. Hepatobiliary: No hepatic masses identified. Intrahepatic biliary ductal dilatation in the left hepatic lobe shows no significant change, however there is increased intrahepatic dilatation seen in the right lobe since prior study. Gallbladder contains T1 hyperintense sludge, but no evidence of dilatation or pericholecystic inflammatory changes. Pancreas: Peripherally enhancing mass with central necrosis is again seen in the pancreatic body measuring 4.2 x 2.9 cm on image 43/1103, which is not significant change since previous study. Atrophy and ductal dilatation is again seen in the tail. 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 Spleen:  Within normal limits in size and appearance. Adrenals/Urinary Tract: No masses identified. No evidence of hydronephrosis. Stomach/Bowel: Marked dilatation of the ascending and transverse colon is seen which is a new finding. There is a enhancing mass at the transition point in the distal transverse colon which measures 4.7 x 3.6 cm on image 49/1104, suspicious for metastasis, with primary colon carcinoma considered less likely. Vascular/Lymphatic: Lymphadenopathy seen in the peripancreatic region and extending in the porta hepatis which in cases and narrows the proximal main portal vein and involves the liver hilum. In the liver hilum, this measures 4.2 x 3.3 cm on image 36/1103, compared to 3.1 x 2.6 cm previously. This causes obstruction of the common hepatic duct. No evidence of abdominal aortic aneurysm. Other:  Mild ascites, without significant change. Musculoskeletal:  No suspicious bone lesions identified. IMPRESSION: New colonic obstruction at the  distal transverse colon due to 4.7 cm soft tissue mass. This is suspicious for metastatic disease, with primary colon carcinoma considered less likely. No significant change in 4 cm mass in the pancreatic body, consistent with primary pancreatic carcinoma. Again seen is encasement of the celiac axis and splenic vein thrombosis. Peripancreatic and porta hepatis lymphadenopathy. Porta hepatis lymphadenopathy is increased since previous study, causing obstruction of the common hepatic duct and new dilatation of intrahepatic bile ducts in right lobe. Mild ascites, without significant change. Electronically Signed   By: JEarle GellM.D.   On: 03/18/2017 08:53   Nm Pet Image Initial (pi) Skull Base To Thigh  Result Date: 03/05/2017 CLINICAL DATA:  Initial treatment strategy for pancreatic adenocarcinoma. Bilateral pulmonary nodules. EXAM: NUCLEAR MEDICINE PET SKULL BASE TO THIGH TECHNIQUE: 12.1 mCi F-18 FDG was injected intravenously. Full-ring PET imaging was performed from the skull base to thigh after the radiotracer. CT data was obtained and used for attenuation correction and anatomic localization. FASTING BLOOD GLUCOSE:  Value: 104 mg/dl COMPARISON:  CT on 02/22/2017 and 02/15/2017 FINDINGS: NECK:  No hypermetabolic lymph nodes or masses. CHEST: No hypermetabolic lymphadenopathy. 10 mm spiculated nodule left upper lobe has SUV max of 4.0. 11 mm solid pulmonary nodule in inferior right upper lobe has SUV max of 1.8. 8 mm pulmonary nodule in right middle lobe has SUV  max of 3.2. 17 mm ground-glass opacity in the posterior right upper lobe shows no significant FDG uptake. No evidence of pleural effusion. Mild calcified pleural plaque noted bilaterally, consistent with asbestos related pleural disease. ABDOMEN/PELVIS: CT images for correlation are limited due to lack of intra-abdominal fat. Soft tissue mass in pancreatic body is hypermetabolic with SUV max of 5.6. Peripancreatic and porta hepatis lymphadenopathy  shows hypermetabolic activity, with index lymph node in the porta hepatis showing SUV max of 6.0. There are also multiple small hypermetabolic peritoneal soft tissue densities throughout the abdomen and pelvis, consistent with peritoneal metastases. Index lesion in the dependent pelvis measures 4.0 cm has SUV max of 5.3. Trace amount of ascites noted. SKELETON: No focal hypermetabolic bone lesions to suggest skeletal metastasis. IMPRESSION: Hypermetabolic mass in the pancreatic body, consistent with known pancreatic carcinoma. Hypermetabolic abdominal lymphadenopathy, consistent with metastatic disease. Multiple peritoneal metastases throughout the abdomen and pelvis . Minimal ascites. Several bilateral hypermetabolic pulmonary nodules, highly suspicious for pulmonary metastases. Electronically Signed   By: Earle Gell M.D.   On: 03/05/2017 10:12   Ct Biopsy  Result Date: 03/11/2017 INDICATION: 67 year old with pancreatic cancer and indeterminate lung nodules. EXAM: CT-GUIDED CORE BIOPSY OF RIGHT UPPER LOBE LUNG NODULE MEDICATIONS: None. ANESTHESIA/SEDATION: Moderate (conscious) sedation was employed during this procedure. A total of Versed 1.0 mg and Fentanyl 50 mcg was administered intravenously. Moderate Sedation Time: 15 minutes. The patient's level of consciousness and vital signs were monitored continuously by radiology nursing throughout the procedure under my direct supervision. FLUOROSCOPY TIME:  None COMPLICATIONS: None immediate. PROCEDURE: Informed written consent was obtained from the patient after a thorough discussion of the procedural risks, benefits and alternatives. All questions were addressed. A timeout was performed prior to the initiation of the procedure. Patient was placed on his right side. CT images through the chest were obtained. Nodule in the anterior inferior right upper lobe was targeted. Right anterior chest was prepped and draped in sterile fashion. Skin was anesthetized with 1%  lidocaine. 62 gauge coaxial needle was directed into the anterior nodule with CT guidance. Two core biopsies were obtained with an 18 gauge core device. Specimens were placed in formalin. 57 gauge coaxial needle was removed with the Bio[employer] tract sealant. Bandage placed over the puncture site. FINDINGS: Mildly spiculated 1.2 cm nodule in the anterior inferior right upper lobe. Needle position was confirmed adjacent to the lesion. Two core biopsies obtained from this lesion. Minimal hemorrhage around the nodule following the core biopsies. Negative for pneumothorax. IMPRESSION: CT-guided core biopsies of the anterior right upper lobe nodule. Electronically Signed   By: Markus Daft M.D.   On: 03/11/2017 16:54   Dg Chest Port 1 View  Result Date: 03/16/2017 CLINICAL DATA:  Port-A-Cath placement. EXAM: PORTABLE CHEST 1 VIEW COMPARISON:  Earlier same day FINDINGS: Power port placed from a right internal jugular approach. Catheter tip is in the SVC 3.5 cm above the right atrium. No pneumothorax. Nodular shadow projecting over the inferior right hilum as seen previously. Lungs otherwise appear clear by radiography. IMPRESSION: Power poor well position with its tip in the SVC 3.5 cm above the right atrium. No pneumothorax. Pulmonary nodule overlying the right inferior hilum as shown by CT. Electronically Signed   By: Nelson Chimes M.D.   On: 03/16/2017 13:22   Acute Abdominal Series  Result Date: 03/19/2017 CLINICAL DATA:  Bowel obstruction EXAM: DG ABDOMEN ACUTE W/ 1V CHEST COMPARISON:  Chest x-ray 03/16/2017, abdominal MRI 03/17/2017. FINDINGS: Right Port-A-Cath remains  in place, unchanged. Heart is normal size. No visible confluent airspace opacities. Previously seen right perihilar nodule not as well visualized on today's study. No effusions. Dilated central bowel loops, likely transverse colon when compared to recent MRI. No organomegaly or free air. Percutaneous biliary catheter in place. IMPRESSION: Dilated  transverse colon with air-fluid levels compatible with distal transverse colonic obstruction as seen on recent MRI. Electronically Signed   By: Rolm Baptise M.D.   On: 03/19/2017 09:31   Dg C-arm 1-60 Min-no Report  Result Date: 03/16/2017 Fluoroscopy was utilized by the requesting physician.  No radiographic interpretation.   Mr Abdomen Mrcp Moise Boring Contast  Result Date: 03/18/2017 CLINICAL DATA:  Rising abnormal liver function tests. Pancreatic carcinoma. EXAM: MRI ABDOMEN WITHOUT AND WITH CONTRAST (INCLUDING MRCP) TECHNIQUE: Multiplanar multisequence MR imaging of the abdomen was performed both before and after the administration of intravenous contrast. Heavily T2-weighted images of the biliary and pancreatic ducts were obtained, and three-dimensional MRCP images were rendered by post processing. CONTRAST:  68m MULTIHANCE GADOBENATE DIMEGLUMINE 529 MG/ML IV SOLN COMPARISON:  02/15/2017 FINDINGS: Lower chest: No acute findings. Hepatobiliary: No hepatic masses identified. Intrahepatic biliary ductal dilatation in the left hepatic lobe shows no significant change, however there is increased intrahepatic dilatation seen in the right lobe since prior study. Gallbladder contains T1 hyperintense sludge, but no evidence of dilatation or pericholecystic inflammatory changes. Pancreas: Peripherally enhancing mass with central necrosis is again seen in the pancreatic body measuring 4.2 x 2.9 cm on image 43/1103, which is not significant change since previous study. Atrophy and ductal dilatation is again seen in the tail. 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 Spleen:  Within normal limits in size and appearance. Adrenals/Urinary Tract: No masses identified. No evidence of hydronephrosis. Stomach/Bowel: Marked dilatation of the ascending and transverse colon is seen which is a new finding. There is a enhancing mass at the transition point in the distal transverse colon  which measures 4.7 x 3.6 cm on image 49/1104, suspicious for metastasis, with primary colon carcinoma considered less likely. Vascular/Lymphatic: Lymphadenopathy seen in the peripancreatic region and extending in the porta hepatis which in cases and narrows the proximal main portal vein and involves the liver hilum. In the liver hilum, this measures 4.2 x 3.3 cm on image 36/1103, compared to 3.1 x 2.6 cm previously. This causes obstruction of the common hepatic duct. No evidence of abdominal aortic aneurysm. Other:  Mild ascites, without significant change. Musculoskeletal:  No suspicious bone lesions identified. IMPRESSION: New colonic obstruction at the distal transverse colon due to 4.7 cm soft tissue mass. This is suspicious for metastatic disease, with primary colon carcinoma considered less likely. No significant change in 4 cm mass in the pancreatic body, consistent with primary pancreatic carcinoma. Again seen is encasement of the celiac axis and splenic vein thrombosis. Peripancreatic and porta hepatis lymphadenopathy. Porta hepatis lymphadenopathy is increased since previous study, causing obstruction of the common hepatic duct and new dilatation of intrahepatic bile ducts in right lobe. Mild ascites, without significant change. Electronically Signed   By: JEarle GellM.D.   On: 03/18/2017 08:53   Ir Int ELianne CureBiliary Drain With Cholangiogram  Result Date: 03/18/2017 INDICATION: 67year old male with pancreatic cancer and obstructed jaundice. He presents for percutaneous transhepatic cholangiography and biliary drain placement. EXAM: Percutaneous transhepatic cholangiography and placement of an internal/external percutaneous biliary drain. MEDICATIONS: 3.375 g Zosyn; The antibiotic was administered within an appropriate time frame prior to  the initiation of the procedure. ANESTHESIA/SEDATION: Moderate (conscious) sedation was employed during this procedure. A total of Versed 1 mg and Fentanyl 100 mcg was  administered intravenously. Moderate Sedation Time: 25 minutes. The patient's level of consciousness and vital signs were monitored continuously by radiology nursing throughout the procedure under my direct supervision. FLUOROSCOPY TIME:  Fluoroscopy Time: 7 minutes 30 seconds (42 mGy). COMPLICATIONS: None immediate. PROCEDURE: Informed written consent was obtained from the patient after a thorough discussion of the procedural risks, benefits and alternatives. All questions were addressed. Maximal Sterile Barrier Technique was utilized including caps, mask, sterile gowns, sterile gloves, sterile drape, hand hygiene and skin antiseptic. A timeout was performed prior to the initiation of the procedure. Under fluoroscopic guidance, a Chiba catheter was advanced intercostal at the anterior axillary line into the liver. As the needle was pulled back, a gentle hand injection of contrast material was performed. There was near immediate opacification of the biliary tree. A percutaneous transhepatic cholangiogram was performed. 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 There is minimal contrast entering the left biliary system. A suitable tertiary radical from the right posterior ductal system was identified as an access point. A 21 gauge Accustick needle was then carefully used to puncture the tertiary radicle. A wire was advanced into the central right hepatic ducts. The needle was exchanged over the wire for the Accustick sheath. The Accustick sheath was advanced into the proximal common hepatic duct just above the occlusion. A road runner wire was successfully navigated across the occlusion and into the distal common bile duct. The Accustick sheath was advanced into the distal common bile duct. Contrast injection confirms patency of the ampulla. The roadrunner wire was advanced through the ampulla and into the duodenum. A 4 French  glide cath was advanced over the wire into the duodenum. The roadrunner wire was then exchanged for a superstiff Amplatz wire. The glide catheter and Accustick sheath were removed. The skin tract was dilated to 10 [place]. A Cook 10.2 [place] biliary drainage catheter was then advanced over the wire and formed within the duodenum. The catheter was flushed. Excellent patency. There is already decompression of the biliary tree. The catheter was then flushed with saline and secured to the skin with 0 Prolene suture. A gravity bag was placed.  The patient tolerated the procedure well. IMPRESSION: 1. 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 Minimal contrast enters the left biliary tree from the right. 2. Successful placement of a 10 French internal/external percutaneous biliary drainage catheter. Signed, Criselda Peaches, MD [facility] Radiology Electronically Signed   By: Jacqulynn Cadet M.D.   On: 03/18/2017 12:49    Microbiology: No results found for this or any previous visit (from the past 240 hour(s)).   Labs: Basic Metabolic Panel: Recent Labs  Lab 03/15/17 1320 03/18/17 0816 03/19/17 0630  NA 136 138 136  K 4.3 3.7 3.6  CL 98 97* 97*  CO2 29 34* 32  GLUCOSE 126 133* 135*  BUN 22 21* 17  CREATININE  --  0.65 0.63  CALCIUM 9.9 9.0 8.8*   Liver Function Tests: Recent Labs  Lab 03/15/17 1320 03/18/17 0816 03/19/17 0630  AST 92* 141* 94*  ALT 146* 129* 119*  ALKPHOS 354* 296* 240*  BILITOT 7.9* 7.5* 4.6*  PROT 7.6 6.5 6.3*  ALBUMIN 3.6 3.1* 2.9*   No results for input(s): LIPASE, AMYLASE in the last 168 hours. No results for input(s): AMMONIA in the last 168 hours. CBC: Recent Labs  Lab 03/15/17 1320 03/18/17 0816  WBC  --  9.3  NEUTROABS 4.5  --   HGB  --  11.3*  HCT 41.6 35.6*  MCV 85.8 86.4  PLT  --  372    Cardiac Enzymes: No results for input(s): CKTOTAL, CKMB, CKMBINDEX, TROPONINI in the last 168 hours. BNP: BNP (last 3 results) No results for input(s): BNP in the last 8760 hours.  ProBNP (last 3 results) No results for input(s): PROBNP in the last 8760 hours.  CBG: Recent Labs  Lab 03/16/17 1250  GLUCAP 103*       Signed:  Kayleen Memos, MD [facility] 03/20/2017, 9:51 AM

## 2017-03-23 ENCOUNTER — Encounter: Payer: Self-pay | Admitting: Hematology

## 2017-03-23 NOTE — Progress Notes (Signed)
Called pt to introduce myself as his Arboriculturist and to discuss copay assistance.  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  I informed her I will have the paperwork for her to complete and will notarize it when completed on 03/24/17.  She was very Patent attorney.

## 2017-03-24 ENCOUNTER — Inpatient Hospital Stay: Payer: BC Managed Care – PPO

## 2017-03-24 ENCOUNTER — Inpatient Hospital Stay: Payer: BC Managed Care – PPO | Admitting: Nutrition

## 2017-03-24 ENCOUNTER — Inpatient Hospital Stay (HOSPITAL_BASED_OUTPATIENT_CLINIC_OR_DEPARTMENT_OTHER): Payer: BC Managed Care – PPO | Admitting: Hematology

## 2017-03-24 VITALS — BP 95/62 | HR 113 | Temp 98.4°F | Resp 18 | Wt 105.2 lb

## 2017-03-24 DIAGNOSIS — Z452 Encounter for adjustment and management of vascular access device: Secondary | ICD-10-CM | POA: Diagnosis not present

## 2017-03-24 DIAGNOSIS — C259 Malignant neoplasm of pancreas, unspecified: Secondary | ICD-10-CM

## 2017-03-24 DIAGNOSIS — C3491 Malignant neoplasm of unspecified part of right bronchus or lung: Secondary | ICD-10-CM

## 2017-03-24 DIAGNOSIS — C3411 Malignant neoplasm of upper lobe, right bronchus or lung: Secondary | ICD-10-CM | POA: Diagnosis not present

## 2017-03-24 DIAGNOSIS — C251 Malignant neoplasm of body of pancreas: Secondary | ICD-10-CM | POA: Diagnosis present

## 2017-03-24 DIAGNOSIS — R634 Abnormal weight loss: Secondary | ICD-10-CM | POA: Diagnosis not present

## 2017-03-24 DIAGNOSIS — R188 Other ascites: Secondary | ICD-10-CM | POA: Diagnosis not present

## 2017-03-24 DIAGNOSIS — G893 Neoplasm related pain (acute) (chronic): Secondary | ICD-10-CM

## 2017-03-24 DIAGNOSIS — R630 Anorexia: Secondary | ICD-10-CM | POA: Diagnosis not present

## 2017-03-24 DIAGNOSIS — C786 Secondary malignant neoplasm of retroperitoneum and peritoneum: Secondary | ICD-10-CM | POA: Diagnosis not present

## 2017-03-24 DIAGNOSIS — Z87891 Personal history of nicotine dependence: Secondary | ICD-10-CM

## 2017-03-24 DIAGNOSIS — R1013 Epigastric pain: Secondary | ICD-10-CM

## 2017-03-24 DIAGNOSIS — D735 Infarction of spleen: Secondary | ICD-10-CM

## 2017-03-24 DIAGNOSIS — R17 Unspecified jaundice: Secondary | ICD-10-CM | POA: Diagnosis not present

## 2017-03-24 DIAGNOSIS — G4700 Insomnia, unspecified: Secondary | ICD-10-CM | POA: Diagnosis not present

## 2017-03-24 DIAGNOSIS — I82890 Acute embolism and thrombosis of other specified veins: Secondary | ICD-10-CM

## 2017-03-24 DIAGNOSIS — K56609 Unspecified intestinal obstruction, unspecified as to partial versus complete obstruction: Secondary | ICD-10-CM | POA: Diagnosis not present

## 2017-03-24 DIAGNOSIS — I700 Atherosclerosis of aorta: Secondary | ICD-10-CM

## 2017-03-24 LAB — CBC WITH DIFFERENTIAL (CANCER CENTER ONLY)
BASOS ABS: 0 10*3/uL (ref 0.0–0.1)
BASOS PCT: 0 %
Eosinophils Absolute: 0 10*3/uL (ref 0.0–0.5)
Eosinophils Relative: 0 %
HCT: 39.8 % (ref 38.4–49.9)
Hemoglobin: 13.1 g/dL (ref 13.0–17.1)
LYMPHS PCT: 29 %
Lymphs Abs: 1.8 10*3/uL (ref 0.9–3.3)
MCH: 28.1 pg (ref 27.2–33.4)
MCHC: 32.8 g/dL (ref 32.0–36.0)
MCV: 85.6 fL (ref 79.3–98.0)
Monocytes Absolute: 0.5 10*3/uL (ref 0.1–0.9)
Monocytes Relative: 8 %
NEUTROS ABS: 3.8 10*3/uL (ref 1.5–6.5)
Neutrophils Relative %: 63 %
PLATELETS: 348 10*3/uL (ref 140–400)
RBC: 4.65 MIL/uL (ref 4.20–5.82)
RDW: 14 % (ref 11.0–15.6)
WBC: 6 10*3/uL (ref 4.0–10.3)

## 2017-03-24 LAB — CMP (CANCER CENTER ONLY)
ALT: 71 U/L — ABNORMAL HIGH (ref 0–55)
ANION GAP: 9 (ref 3–11)
AST: 44 U/L — ABNORMAL HIGH (ref 5–34)
Albumin: 3.1 g/dL — ABNORMAL LOW (ref 3.5–5.0)
Alkaline Phosphatase: 203 U/L — ABNORMAL HIGH (ref 40–150)
BILIRUBIN TOTAL: 4.1 mg/dL — AB (ref 0.2–1.2)
BUN: 31 mg/dL — ABNORMAL HIGH (ref 7–26)
CALCIUM: 9.2 mg/dL (ref 8.4–10.4)
CO2: 27 mmol/L (ref 22–29)
Chloride: 100 mmol/L (ref 98–109)
Creatinine: 0.86 mg/dL (ref 0.70–1.30)
GFR, Est AFR Am: >60 mL/min (ref >60)
Glucose, Bld: 109 mg/dL (ref 70–140)
POTASSIUM: 4.4 mmol/L (ref 3.5–5.1)
Sodium: 136 mmol/L (ref 136–145)
TOTAL PROTEIN: 6.7 g/dL (ref 6.4–8.3)

## 2017-03-24 MED ORDER — SODIUM CHLORIDE 0.9 % IJ SOLN
10.0000 mL | Freq: Once | INTRAMUSCULAR | Status: AC
  Administered 2017-03-24: 10 mL
  Filled 2017-03-24: qty 10

## 2017-03-24 MED ORDER — OXYCODONE HCL 5 MG PO TABS: 5.0000 mg | ORAL_TABLET | ORAL | 0 refills | Status: AC | PRN

## 2017-03-24 MED ORDER — HEPARIN SOD (PORK) LOCK FLUSH 100 UNIT/ML IV SOLN
500.0000 [IU] | Freq: Once | INTRAVENOUS | Status: AC
  Administered 2017-03-24: 500 [IU]
  Filled 2017-03-24: qty 5

## 2017-03-24 MED ORDER — ENOXAPARIN SODIUM 80 MG/0.8ML ~~LOC~~ SOLN: 80.0000 mg | SUBCUTANEOUS | 1 refills | Status: AC

## 2017-03-24 MED FILL — NORMAL SALINE FLUSH SYRINGE: 0.9 | 30 days supply | Qty: 300 | Fill #0

## 2017-03-24 MED FILL — ENOXAPARIN 80 MG/0.8 ML SYR: 80 | 30 days supply | Qty: 24 | Fill #0

## 2017-03-24 MED FILL — oxyCODONE HCL 5 MG TABS: 5 | 7 days supply | Qty: 90 | Fill #0

## 2017-03-24 NOTE — Progress Notes (Signed)
[place]  [phone] [phone]  Follow Up note   Patient Care Team: Gildardo Pounds, NP as PCP - General (Nurse Practitioner) Truitt Merle, MD as Consulting Physician (Hematology) [name], MD as Consulting Physician (General Surgery) Alla Feeling, NP as Nurse Practitioner (Nurse Practitioner) 03/24/2017   CHIEF COMPLAINT: F/u metastatic pancreas cancer  SUMMARY OF ONCOLOGIC HISTORY: Oncology History   Cancer Staging Malignant neoplasm of body of pancreas [facility]) Staging form: Exocrine Pancreas, AJCC 8th Edition - Clinical stage from 02/18/2017: Stage IV (cT3, cN2, cM1) - Signed by Truitt Merle, MD on 03/08/2017       Malignant neoplasm of body of pancreas [facility])    Initial Diagnosis    Malignant neoplasm of body of pancreas ([place])      02/15/2017 Imaging    CT AP w contrast IMPRESSION: 1. Mass within the body of the pancreas measuring approximately 3.8 cm in greatest diameter. 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 The pancreatic mass causes distal atrophy and ductal dilatation of the tail of the pancreas. There may be metastatic spread to a gastrohepatic lymph node measuring approximately 2 cm. Margination of this potential metastatic lymph node is very difficult due to the lack of intra-abdominal fat and further soft tissue delineation may be possible with MRI of the abdomen. 2. Probable trace ascites in the peritoneal cavity. 3. Small adjacent calcifications along the subpleural/pleural left posterior hemithorax has a benign appearance and represents either 2 adjacent calcified granulomata or partially calcified pleural plaque.       02/15/2017 Imaging    MR abdomen w/wo contrast    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  Bulky peripancreatic adenopathy with extension into the porta hepatis. This causes obstruction of left intrahepatic bile ducts.      02/18/2017 Initial Biopsy    Diagnosis FINE NEEDLE ASPIRATION, ENDOSCOPIC, PANCREAS, BODY (SPECIMEN 1 OF 1 COLLECTED 02/18/17): MALIGNANT CELLS CONSISTENT WITH ADENOCARCINOMA.      02/22/2017 Imaging    CT chest w contrast IMPRESSION: 1. Bilateral subsolid and partially solid nodules in addition to a solid 1.3 by 1.1 cm right upper lobe nodule, appearance suspicious for malignancy. Given the spiculated and subsolid appearance of some of these nodules, I cannot exclude a second primary lung malignancy in addition to the known pancreatic lesion. 2. Known pancreatic mass with stable findings in the upper abdomen. 3. 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 4.  Aortic Atherosclerosis (ICD10-I70.0).        03/05/2017 Imaging    PET Scan IMPRESSION: Hypermetabolic mass in the pancreatic body, consistent with known pancreatic carcinoma. Hypermetabolic abdominal lymphadenopathy, consistent with metastatic disease. Multiple peritoneal metastases throughout the abdomen and pelvis. Minimal ascites. Several bilateral hypermetabolic pulmonary nodules, highly suspicious for pulmonary metastases.      03/11/2017 Pathology Results    Diagnosis Lung, needle/core biopsy(ies), Right Upper Lobe - ADENOCARCINOMA, CONSISTENT WITH LUNG PRIMARY. - SEE COMMENT. Microscopic Comment The malignant cells are positive for TTF-1 and cytokeratin 7. They are negative for CDX2 and cytokeratin 20. The findings are consistent with primary lung adenocarcinoma. (JBKj 03/15/2016) Dr. Burr Medico was paged on 03/15/2017. (JBK:ah 03/15/17).  03/16/2017 - 03/18/2017 Hospital Admission    Bowel  obstruction due to mass metastasizing to the colon 4.7 x 3.1 cm         Adenocarcinoma of lung, right ([place])   03/11/2017 Pathology Results    Diagnosis Lung, needle/core biopsy(ies), Right Upper Lobe - ADENOCARCINOMA, CONSISTENT WITH LUNG PRIMARY. - SEE COMMENT. Microscopic Comment The malignant cells are positive for TTF-1 and cytokeratin 7. They are negative for CDX2 and cytokeratin 20. The findings are consistent with primary lung adenocarcinoma. (JBKj 03/15/2016) Dr. Burr Medico was paged on 03/15/2017. (JBK:ah 03/15/17).      03/11/2017 Initial Biopsy    CT BIOPSY FINDINGS: Mildly spiculated 1.2 cm nodule in the anterior inferior right upper lobe. Needle position was confirmed adjacent to the lesion. Two core biopsies obtained from this lesion. Minimal hemorrhage around the nodule following the core biopsies. Negative for pneumothorax.  IMPRESSION: CT-guided core biopsies of the anterior right upper lobe nodule.       03/15/2017 Initial Diagnosis    Adenocarcinoma of lung, right ([place])      HISTORY OF PRESENTING ILLNESS: Fred Bradford began feeling "uneasy" in his middle abdomen in early November that developed into persistent pain. He was lifting a heavy box at work 02/12/17 and experienced acutely increased pain. He presented to urgent care few days after and was referred to oncologist. However, pain escalated and he went to ED 02/15/17. From onset of symptoms he has lost approx 40 pounds due to decreased appetite. He has no significant past medical or surgical history. He has family history positive for pancreas cancer (mother), and prostate cancer (father). He has 2 sons who are alive and healthy. He smoked cigarettes for 50 years, quit in 2017 but has recently smoked few cigarettes due to stress over his diagnosis. He lives with significant other. He works at [employer] and is independent with ADLs and activities. He drives himself.   Fred Bradford presents for first f/u since hospital  discharge on 02/17/17. Has been resting a lot lately, not currently working. Reports non-radiating pain in middle abdomen 4/10. He takes 2 percocet q6 hours PRN, improves pain to 1/10. Mild constipation, senna does not work. He maintains regular BM with mag citrate; denies nausea, vomiting, diarrhea. Has been taking trazadone for insomnia since hospital d/c which has been helping but last night could not sleep. His mood is positive.    CURRENT THERAPY: Hospice care  INTERVAL HISTORY:  Fred Bradford presents to the office today for follow up. He is accompanied by his partner today. He was recently hospitalized percutaneous biliary drainage and was found to have bowel obstruction. He declined surgery and was discharged home with Hospice. Hospice has come to pt's house and they are set up with the materials they need.   There is 100 mg draining per day out of his catheter. He is drinking broth daily. He denies new pain and states that the pain medication helps.   His partner voices concern for if the patient should continue with his anticoagulant injections prior to his biopsy on 03/11/2017 at 8:45 AM. He denies any bleeding at the injection site of anticoagulant injections. His partner notes that the patient is out of the oxycodone and he takes it every 6 hours and averages 4-5 tablets a day due to abdominal pain.   Since his last visit to the office, he underwent a PET scan on 03/05/2017 with results showing: IMPRESSION: Hypermetabolic mass in the pancreatic body, consistent with known pancreatic carcinoma. Hypermetabolic  abdominal lymphadenopathy, consistent with metastatic disease. Multiple peritoneal metastases throughout the abdomen and pelvis. Minimal ascites. Several bilateral hypermetabolic pulmonary nodules, highly suspicious for pulmonary metastases.  On review of systems, pt denies dizziness, fever, chills. He is using Ensure and Boost and he is not able to chew a lot due to his dental issues.  He has a Pharmacist, community and is unable to make an appointment prior to chemotherapy due to finances. He reports that he has been eating protein to aid with his dental issues. He reports weight gain (2 lbs) as well as increased belching. He reports abdominal pain as well.     REVIEW OF SYSTEMS:   Constitutional: Denies fevers, chills (+) abnormal weight loss (+) decreased appetite  Eyes: Denies blurriness of vision Ears, nose, mouth, throat, and face: Denies mucositis or sore throat Respiratory: Denies cough, dyspnea or wheezes Cardiovascular: Denies palpitation, chest discomfort or lower extremity swelling Gastrointestinal:  Denies nausea, vomiting, constipation, diarrhea, heartburn or change in bowel habits (+) Abdominal pain.  Skin: Denies abnormal skin rashes Lymphatics: Denies new lymphadenopathy or easy bruising Neurological:Denies numbness, tingling or new weaknesses Behavioral/Psych: Mood is stable, no new changes  All other systems were reviewed with the patient and are negative.  MEDICAL HISTORY:  Past Medical History:  Diagnosis Date  . Adenocarcinoma of lung, right ([place]) 03/15/2017  . Jaundice 03/2017  . Lesion of colon 03/18/2017  . Lung nodules 03/2017  . Pancreatic cancer [facility])     SURGICAL HISTORY: Past Surgical History:  Procedure Laterality Date  . EUS N/A 02/18/2017   Procedure: UPPER ENDOSCOPIC ULTRASOUND (EUS) LINEAR;  Surgeon: [name], MD;  Location: [facility] ENDOSCOPY;  Service: Endoscopy;  Laterality: N/A;  . FINE NEEDLE ASPIRATION N/A 02/18/2017   Procedure: FINE NEEDLE ASPIRATION (FNA) LINEAR;  Surgeon: [name], MD;  Location: [facility] ENDOSCOPY;  Service: Endoscopy;  Laterality: N/A;  pancreatic mass, needs biopsy.    . IR INT EXT BILIARY DRAIN WITH CHOLANGIOGRAM  03/18/2017  . PORTACATH PLACEMENT Right 03/16/2017   Procedure: INSERTION PORT-A-CATH;  Surgeon: [name], MD;  Location: [facility] ORS;  Service: General;  Laterality: Right;    I have reviewed the  social history and family history with the patient and they are unchanged from previous note.  ALLERGIES:  has No Known Allergies.  MEDICATIONS:  Current Outpatient Medications  Medication Sig Dispense Refill  . oxyCODONE-acetaminophen (PERCOCET/ROXICET) 5-325 MG tablet Take 1-2 tablets by mouth every 6 (six) hours as needed for severe pain. 30 tablet 0  . traZODone (DESYREL) 100 MG tablet Take 1 tablet (100 mg total) by mouth at bedtime. 30 tablet 0  . enoxaparin (LOVENOX) 80 MG/0.8ML injection Inject 0.8 mLs (80 mg total) into the skin daily. 30 Syringe 1  . oxyCODONE (OXY IR/ROXICODONE) 5 MG immediate release tablet Take 1-2 tablets (5-10 mg total) by mouth every 4 (four) hours as needed for severe pain. 90 tablet 0   No current facility-administered medications for this visit.     PHYSICAL EXAMINATION:  ECOG PERFORMANCE STATUS:3  Vitals:   03/24/17 1205  BP: 95/62  Pulse: (!) 113  Resp: 18  Temp: 98.4 F (36.9 C)  SpO2: 100%   Filed Weights   03/24/17 1205  Weight: 105 lb 3.2 oz (47.7 kg)    GENERAL:alert, no distress and comfortable (+) cachectic appearance SKIN: skin color, texture, turgor are normal, no rashes or significant lesions EYES: normal, Conjunctiva are pink and non-injected, sclera clear OROPHARYNX:no exudate, no erythema and lips, buccal  mucosa, and tongue normal  NECK: supple, thyroid normal size, non-tender, without nodularity LYMPH:  no palpable cervical, supraclavicular, axillary, or inguinal lymphadenopathy  LUNGS: clear to auscultation bilaterally with normal breathing effort HEART: regular rate & rhythm and no murmurs and no lower extremity edema ABDOMEN:abdomen soft, non-tender and normal bowel sounds. Drainage around the biliary drainage tube. Musculoskeletal:no cyanosis of digits and no clubbing  NEURO: alert & oriented x 3 with fluent speech, no focal motor/sensory deficits  LABORATORY DATA:  I have reviewed the data as listed CBC Latest Ref  Rng & Units 03/24/2017 03/18/2017 03/15/2017  WBC 4.0 - 10.3 K/uL 6.0 9.3 7.0  Hemoglobin 13.0 - 17.0 g/dL - 11.3(L) -  Hematocrit 38.4 - 49.9 % 39.8 35.6(L) 41.6  Platelets 140 - 400 K/uL 348 372 399    CMP Latest Ref Rng & Units 03/24/2017 03/19/2017 03/18/2017  Glucose 70 - 140 mg/dL 109 135(H) 133(H)  BUN 7 - 26 mg/dL 31(H) 17 21(H)  Creatinine 0.61 - 1.24 mg/dL - 0.63 0.65  Sodium 136 - 145 mmol/L 136 136 138  Potassium 3.5 - 5.1 mmol/L 4.4 3.6 3.7  Chloride 98 - 109 mmol/L 100 97(L) 97(L)  CO2 22 - 29 mmol/L 27 32 34(H)  Calcium 8.4 - 10.4 mg/dL 9.2 8.8(L) 9.0  Total Protein 6.4 - 8.3 g/dL 6.7 6.3(L) 6.5  Total Bilirubin 0.2 - 1.2 mg/dL 4.1(HH) 4.6(H) 7.5(H)  Alkaline Phos 40 - 150 U/L 203(H) 240(H) 296(H)  AST 5 - 34 U/L 44(H) 94(H) 141(H)  ALT 0 - 55 U/L 71(H) 119(H) 129(H)   PATHOLOGY Diagnosis 03/11/17 Lung, needle/core biopsy(ies), Right Upper Lobe - ADENOCARCINOMA, CONSISTENT WITH LUNG PRIMARY. - SEE COMMENT. Microscopic Comment The malignant cells are positive for TTF-1 and cytokeratin 7. They are negative for CDX2 and cytokeratin 20. The findings are consistent with primary lung adenocarcinoma. (JBKj 03/15/2016) Dr. Burr Medico was paged on 03/15/2017. (JBK:ah 03/15/17).  RADIOGRAPHIC STUDIES: I have personally reviewed the radiological images as listed and agreed with the findings in the report. Dg Chest 1 View  Result Date: 03/11/2017 CLINICAL DATA:  Status post lung biopsy EXAM: CHEST 1 VIEW COMPARISON:  CT biopsy films from earlier in the same day. FINDINGS: Cardiac shadow is within normal limits. The lungs are well aerated bilaterally. Right upper lobe lung nodule is again noted with changes of recent biopsy. No definitive pneumothorax is seen. IMPRESSION: No pneumothorax following lung biopsy. Electronically Signed   By: Inez Catalina M.D.   On: 03/11/2017 14:55   US Abdomen Complete  Result Date: 03/16/2017 CLINICAL DATA:  67 year old male with pancreatic cancer and jaundice.  Initial encounter. EXAM: ABDOMEN ULTRASOUND COMPLETE COMPARISON:  03/05/2017. FINDINGS: Gallbladder: Echogenic material within the gallbladder with mild gallbladder wall thickening measuring up to 3.2 mm. On prior PET-CT, radiopaque material seen within the gallbladder which may represent stones or sludge. Patient was not tender over the gallbladder during scanning per ultrasound technologist. Common bile duct: Diameter: 6.9 mm. Liver: Intrahepatic biliary duct prominence. Suspect mild periportal edema. Portal vein is patent on color Doppler imaging with normal direction of blood flow towards the liver. IVC: No abnormality visualized. Pancreas: Heterogeneous pancreatic head mass. Spleen: Size and appearance within normal limits. Right Kidney: Length: 10 cm. Echogenicity within normal limits. No mass or hydronephrosis visualized. Left Kidney: Length: 10.1 cm. Echogenicity within normal limits. No mass or hydronephrosis visualized. Abdominal aorta: Atherosclerotic changes with slight ectasia without focal aneurysm with maximal AP dimension 2.6 cm. Other findings: None. IMPRESSION: Pancreatic head mass  with subsequent dilated common bile duct and mild intrahepatic biliary duct dilation. Mild periportal edema also suspected. Gallbladder sludge/stones with mild gallbladder wall thickening without tenderness over the gallbladder during scanning. Atherosclerotic changes aorta without focal aneurysm. Electronically Signed   By: Genia Del M.D.   On: 03/16/2017 10:37   Mr 3d Recon At Scanner  Result Date: 03/18/2017 CLINICAL DATA:  Rising abnormal liver function tests. Pancreatic carcinoma. EXAM: MRI ABDOMEN WITHOUT AND WITH CONTRAST (INCLUDING MRCP) TECHNIQUE: Multiplanar multisequence MR imaging of the abdomen was performed both before and after the administration of intravenous contrast. Heavily T2-weighted images of the biliary and pancreatic ducts were obtained, and three-dimensional MRCP images were rendered by  post processing. CONTRAST:  48m MULTIHANCE GADOBENATE DIMEGLUMINE 529 MG/ML IV SOLN COMPARISON:  02/15/2017 FINDINGS: Lower chest: No acute findings. Hepatobiliary: No hepatic masses identified. Intrahepatic biliary ductal dilatation in the left hepatic lobe shows no significant change, however there is increased intrahepatic dilatation seen in the right lobe since prior study. Gallbladder contains T1 hyperintense sludge, but no evidence of dilatation or pericholecystic inflammatory changes. Pancreas: Peripherally enhancing mass with central necrosis is again seen in the pancreatic body measuring 4.2 x 2.9 cm on image 43/1103, which is not significant change since previous study. Atrophy and ductal dilatation is again seen in the tail. 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 Spleen:  Within normal limits in size and appearance. Adrenals/Urinary Tract: No masses identified. No evidence of hydronephrosis. Stomach/Bowel: Marked dilatation of the ascending and transverse colon is seen which is a new finding. There is a enhancing mass at the transition point in the distal transverse colon which measures 4.7 x 3.6 cm on image 49/1104, suspicious for metastasis, with primary colon carcinoma considered less likely. Vascular/Lymphatic: Lymphadenopathy seen in the peripancreatic region and extending in the porta hepatis which in cases and narrows the proximal main portal vein and involves the liver hilum. In the liver hilum, this measures 4.2 x 3.3 cm on image 36/1103, compared to 3.1 x 2.6 cm previously. This causes obstruction of the common hepatic duct. No evidence of abdominal aortic aneurysm. Other:  Mild ascites, without significant change. Musculoskeletal:  No suspicious bone lesions identified. IMPRESSION: New colonic obstruction at the distal transverse colon due to 4.7 cm soft tissue mass. This is suspicious for metastatic disease, with primary colon carcinoma considered  less likely. No significant change in 4 cm mass in the pancreatic body, consistent with primary pancreatic carcinoma. Again seen is encasement of the celiac axis and splenic vein thrombosis. Peripancreatic and porta hepatis lymphadenopathy. Porta hepatis lymphadenopathy is increased since previous study, causing obstruction of the common hepatic duct and new dilatation of intrahepatic bile ducts in right lobe. Mild ascites, without significant change. Electronically Signed   By: JEarle GellM.D.   On: 03/18/2017 08:53   Nm Pet Image Initial (pi) Skull Base To Thigh  Result Date: 03/05/2017 CLINICAL DATA:  Initial treatment strategy for pancreatic adenocarcinoma. Bilateral pulmonary nodules. EXAM: NUCLEAR MEDICINE PET SKULL BASE TO THIGH TECHNIQUE: 12.1 mCi F-18 FDG was injected intravenously. Full-ring PET imaging was performed from the skull base to thigh after the radiotracer. CT data was obtained and used for attenuation correction and anatomic localization. FASTING BLOOD GLUCOSE:  Value: 104 mg/dl COMPARISON:  CT on 02/22/2017 and 02/15/2017 FINDINGS: NECK:  No hypermetabolic lymph nodes or masses. CHEST: No hypermetabolic lymphadenopathy. 10 mm spiculated nodule left upper lobe has SUV max of 4.0. 11 mm solid  pulmonary nodule in inferior right upper lobe has SUV max of 1.8. 8 mm pulmonary nodule in right middle lobe has SUV max of 3.2. 17 mm ground-glass opacity in the posterior right upper lobe shows no significant FDG uptake. No evidence of pleural effusion. Mild calcified pleural plaque noted bilaterally, consistent with asbestos related pleural disease. ABDOMEN/PELVIS: CT images for correlation are limited due to lack of intra-abdominal fat. Soft tissue mass in pancreatic body is hypermetabolic with SUV max of 5.6. Peripancreatic and porta hepatis lymphadenopathy shows hypermetabolic activity, with index lymph node in the porta hepatis showing SUV max of 6.0. There are also multiple small  hypermetabolic peritoneal soft tissue densities throughout the abdomen and pelvis, consistent with peritoneal metastases. Index lesion in the dependent pelvis measures 4.0 cm has SUV max of 5.3. Trace amount of ascites noted. SKELETON: No focal hypermetabolic bone lesions to suggest skeletal metastasis. IMPRESSION: Hypermetabolic mass in the pancreatic body, consistent with known pancreatic carcinoma. Hypermetabolic abdominal lymphadenopathy, consistent with metastatic disease. Multiple peritoneal metastases throughout the abdomen and pelvis . Minimal ascites. Several bilateral hypermetabolic pulmonary nodules, highly suspicious for pulmonary metastases. Electronically Signed   By: Earle Gell M.D.   On: 03/05/2017 10:12   Ct Biopsy  Result Date: 03/11/2017 INDICATION: 67 year old with pancreatic cancer and indeterminate lung nodules. EXAM: CT-GUIDED CORE BIOPSY OF RIGHT UPPER LOBE LUNG NODULE MEDICATIONS: None. ANESTHESIA/SEDATION: Moderate (conscious) sedation was employed during this procedure. A total of Versed 1.0 mg and Fentanyl 50 mcg was administered intravenously. Moderate Sedation Time: 15 minutes. The patient's level of consciousness and vital signs were monitored continuously by radiology nursing throughout the procedure under my direct supervision. FLUOROSCOPY TIME:  None COMPLICATIONS: None immediate. PROCEDURE: Informed written consent was obtained from the patient after a thorough discussion of the procedural risks, benefits and alternatives. All questions were addressed. A timeout was performed prior to the initiation of the procedure. Patient was placed on his right side. CT images through the chest were obtained. Nodule in the anterior inferior right upper lobe was targeted. Right anterior chest was prepped and draped in sterile fashion. Skin was anesthetized with 1% lidocaine. 46 gauge coaxial needle was directed into the anterior nodule with CT guidance. Two core biopsies were obtained with  an 18 gauge core device. Specimens were placed in formalin. 24 gauge coaxial needle was removed with the Bio[employer] tract sealant. Bandage placed over the puncture site. FINDINGS: Mildly spiculated 1.2 cm nodule in the anterior inferior right upper lobe. Needle position was confirmed adjacent to the lesion. Two core biopsies obtained from this lesion. Minimal hemorrhage around the nodule following the core biopsies. Negative for pneumothorax. IMPRESSION: CT-guided core biopsies of the anterior right upper lobe nodule. Electronically Signed   By: Markus Daft M.D.   On: 03/11/2017 16:54   Dg Chest Port 1 View  Result Date: 03/16/2017 CLINICAL DATA:  Port-A-Cath placement. EXAM: PORTABLE CHEST 1 VIEW COMPARISON:  Earlier same day FINDINGS: Power port placed from a right internal jugular approach. Catheter tip is in the SVC 3.5 cm above the right atrium. No pneumothorax. Nodular shadow projecting over the inferior right hilum as seen previously. Lungs otherwise appear clear by radiography. IMPRESSION: Power poor well position with its tip in the SVC 3.5 cm above the right atrium. No pneumothorax. Pulmonary nodule overlying the right inferior hilum as shown by CT. Electronically Signed   By: Nelson Chimes M.D.   On: 03/16/2017 13:22   Acute Abdominal Series  Result Date: 03/19/2017 CLINICAL DATA:  Bowel obstruction EXAM: DG ABDOMEN ACUTE W/ 1V CHEST COMPARISON:  Chest x-ray 03/16/2017, abdominal MRI 03/17/2017. FINDINGS: Right Port-A-Cath remains in place, unchanged. Heart is normal size. No visible confluent airspace opacities. Previously seen right perihilar nodule not as well visualized on today's study. No effusions. Dilated central bowel loops, likely transverse colon when compared to recent MRI. No organomegaly or free air. Percutaneous biliary catheter in place. IMPRESSION: Dilated transverse colon with air-fluid levels compatible with distal transverse colonic obstruction as seen on recent MRI. Electronically  Signed   By: Rolm Baptise M.D.   On: 03/19/2017 09:31   Dg C-arm 1-60 Min-no Report  Result Date: 03/16/2017 Fluoroscopy was utilized by the requesting physician.  No radiographic interpretation.   Mr Abdomen Mrcp Moise Boring Contast  Result Date: 03/18/2017 CLINICAL DATA:  Rising abnormal liver function tests. Pancreatic carcinoma. EXAM: MRI ABDOMEN WITHOUT AND WITH CONTRAST (INCLUDING MRCP) TECHNIQUE: Multiplanar multisequence MR imaging of the abdomen was performed both before and after the administration of intravenous contrast. Heavily T2-weighted images of the biliary and pancreatic ducts were obtained, and three-dimensional MRCP images were rendered by post processing. CONTRAST:  35m MULTIHANCE GADOBENATE DIMEGLUMINE 529 MG/ML IV [facility] COMPARISON:  02/15/2017 FINDINGS: Lower chest: No acute findings. Hepatobiliary: No hepatic masses identified. Intrahepatic biliary ductal dilatation in the left hepatic lobe shows no significant change, however there is increased intrahepatic dilatation seen in the right lobe since prior study. Gallbladder contains T1 hyperintense sludge, but no evidence of dilatation or pericholecystic inflammatory changes. Pancreas: Peripherally enhancing mass with central necrosis is again seen in the pancreatic body measuring 4.2 x 2.9 cm on image 43/1103, which is not significant change since previous study. Atrophy and ductal dilatation is again seen in the tail. 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 Spleen:  Within normal limits in size and appearance. Adrenals/Urinary Tract: No masses identified. No evidence of hydronephrosis. Stomach/Bowel: Marked dilatation of the ascending and transverse colon is seen which is a new finding. There is a enhancing mass at the transition point in the distal transverse colon which measures 4.7 x 3.6 cm on image 49/1104, suspicious for metastasis, with primary colon carcinoma considered less likely.  Vascular/Lymphatic: Lymphadenopathy seen in the peripancreatic region and extending in the porta hepatis which in cases and narrows the proximal main portal vein and involves the liver hilum. In the liver hilum, this measures 4.2 x 3.3 cm on image 36/1103, compared to 3.1 x 2.6 cm previously. This causes obstruction of the common hepatic duct. No evidence of abdominal aortic aneurysm. Other:  Mild ascites, without significant change. Musculoskeletal:  No suspicious bone lesions identified. IMPRESSION: New colonic obstruction at the distal transverse colon due to 4.7 cm soft tissue mass. This is suspicious for metastatic disease, with primary colon carcinoma considered less likely. No significant change in 4 cm mass in the pancreatic body, consistent with primary pancreatic carcinoma. Again seen is encasement of the celiac axis and splenic vein thrombosis. Peripancreatic and porta hepatis lymphadenopathy. Porta hepatis lymphadenopathy is increased since previous study, causing obstruction of the common hepatic duct and new dilatation of intrahepatic bile ducts in right lobe. Mild ascites, without significant change. Electronically Signed   By: JEarle GellM.D.   On: 03/18/2017 08:53   Ir Int ELianne CureBiliary Drain With Cholangiogram  Result Date: 03/18/2017 INDICATION: 67year old male with pancreatic cancer and obstructed jaundice. He presents for percutaneous transhepatic cholangiography and biliary drain placement. EXAM: Percutaneous transhepatic cholangiography and placement  of an internal/external percutaneous biliary drain. MEDICATIONS: 3.375 g Zosyn; The antibiotic was administered within an appropriate time frame prior to the initiation of the procedure. ANESTHESIA/SEDATION: Moderate (conscious) sedation was employed during this procedure. A total of Versed 1 mg and Fentanyl 100 mcg was administered intravenously. Moderate Sedation Time: 25 minutes. The patient's level of consciousness and vital signs were  monitored continuously by radiology nursing throughout the procedure under my direct supervision. FLUOROSCOPY TIME:  Fluoroscopy Time: 7 minutes 30 seconds (42 mGy). COMPLICATIONS: None immediate. PROCEDURE: Informed written consent was obtained from the patient after a thorough discussion of the procedural risks, benefits and alternatives. All questions were addressed. Maximal Sterile Barrier Technique was utilized including caps, mask, sterile gowns, sterile gloves, sterile drape, hand hygiene and skin antiseptic. A timeout was performed prior to the initiation of the procedure. Under fluoroscopic guidance, a Chiba catheter was advanced intercostal at the anterior axillary line into the liver. As the needle was pulled back, a gentle hand injection of contrast material was performed. There was near immediate opacification of the biliary tree. A percutaneous transhepatic cholangiogram was performed. 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 There is minimal contrast entering the left biliary system. A suitable tertiary radical from the right posterior ductal system was identified as an access point. A 21 gauge Accustick needle was then carefully used to puncture the tertiary radicle. A wire was advanced into the central right hepatic ducts. The needle was exchanged over the wire for the Accustick sheath. The Accustick sheath was advanced into the proximal common hepatic duct just above the occlusion. A road runner wire was successfully navigated across the occlusion and into the distal common bile duct. The Accustick sheath was advanced into the distal common bile duct. Contrast injection confirms patency of the ampulla. The roadrunner wire was advanced through the ampulla and into the duodenum. A 4 French glide cath was advanced over the wire into the duodenum. The roadrunner wire was then exchanged for a superstiff Amplatz wire.  The glide catheter and Accustick sheath were removed. The skin tract was dilated to 10 [place]. A Cook 10.2 [place] biliary drainage catheter was then advanced over the wire and formed within the duodenum. The catheter was flushed. Excellent patency. There is already decompression of the biliary tree. The catheter was then flushed with saline and secured to the skin with 0 Prolene suture. A gravity bag was placed.  The patient tolerated the procedure well. IMPRESSION: 1. 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 Minimal contrast enters the left biliary tree from the right. 2. Successful placement of a 10 French internal/external percutaneous biliary drainage catheter. Signed, Criselda Peaches, MD [facility] Electronically Signed   By: Jacqulynn Cadet M.D.   On: 03/18/2017 12:49    ASSESSMENT & PLAN: 67 y.o. male with no past medical history presented with vague abdominal pain now with adenocarcinoma of the pancreas and multiple lung nodules found on staging work up.  1. Adenocarcinoma of body of pancreas, with peritoneal and lung metastasis, cT3N2M1, stage IV  -We reviewed imaging and pathology results in detail. The biopsy of the 4.3 cm mass in the body of the pancreas demonstrated adenocarcinoma, we discussed the usually aggressive nature of his disease.  -CT indicates bulky peripancreatic adenopathy with extension into the porta hepatis which  is causing obstruction of left intrahepatic bile ducts. He has at least locally advanced disease, but likely metastatic, as there are multiple lung nodules found on CT chest suspicious for metastatic pancreatic cancer vs other primary lung cancer.  -I reviewed his PET scan findings, which showed hypermetabolic peritoneum metastasis and hypermetabolic lung nodules, which are likely metastatic  disease.  -Discussed nature history of pancreatic cancer, and incurable nature of metastatic disease.  Patient understand the overall very poor prognosis.  -Surgery is not an option at this stage.  I discussed the treatment options for metastatic pancreatic cancer, which is largely chemotherapy.  I will check his tumor for MMR/MSI to see if he is a candidate for immunotherapy, and also recommend him to see genetic counseling for testing including BRCA1/2, to see if he is a candidate for PARP inhibitor. -Unfortunately with the bowel obstruction, his nutrition intake will be severely compromised, he will not be a candidate for palliative chemotherapy. Pt declined diverting colostomy, clonic stent placement is also not an option due to the location -Patient has enrolled to hospice  -There is some leakage from his catheter with discharge. I will call intervention radiology to evaluate  -continue low residual diet  -continue supportive care, I refilled his oxycodone   2. RUL lung adenocarcinoma, T1N0M) stage I -Patient smoked 1 PPD for 50 years, he is at high risk for lung cancer.  -CT chest indicates bilateral subsolid and partially solid nodules in addition to a solid 1.3 by 1.1 cm right upper lobe nodule, appearance suspicious for malignancy and a second lung primary could not be ruled out.  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  -His pancreatic cancer places him at higher risk for thromboembolism.  -Will anticoagulate with lovenox 80 mg/day. He will receive first injection here with teaching per RN. -Advised the patient and his partner to discontinue use of lovenox on 03/10/2017 and 03/11/2017 prior to CT biopsy and then to continue use on 03/12/2017.  -We reviewed s/sx to report such as any bleeding   4. Weight loss, severe protein and calorie malnutrition -He has cachetic  appearance with 40 pounds weight loss in 1.5 months due to decreased appetite. -His partner has been making him protein smoothies, I encouraged him to continue this and to add 2-3 boost or ensure per day to add calories to his diet. He needs to gain weight in order to tolerate chemotherapy. -His partner has continued to feed the patient protein rich foods and soft foods to aid with his nutrition, due to this, the patient has gained 2 lbs since his last visit on 02/26/2017.  -I prescribed Remeron to improve appetite, this will also help his insomnia, previously treated with trazadone; his mood is good now.  5.  Borderline hypotension -Related to low p.o. Intake -Patient to drink more fluids, and add some salt in his diet.  6. Abdominal pain -Secondary to his underlying pancreatic cancer -He has been taking Percocet as needed.  I prescribed oxycodone 5 mg every 4 hours as needed for pain today.  7. Goal of care discussion  -We again discussed the incurable nature of his cancer, and the overall poor prognosis -The patient understands the goal of care is palliative. -I recommend DNR/DNI, he agrees -he is under hospice care now     PLAN:  I will call intervention radiology to evaluate his catheter Continue Hospice care Refill  oxycodone and lovenox I will see him as needed, will remain to be his MD      No orders of the defined types were placed in this encounter.  All questions were answered. The patient knows to call the clinic with any problems, questions or concerns. No barriers to learning was detected.  I spent 20 minutes counseling the patient face to face. The total time spent in the appointment was 25 minutes and more than 50% was on counseling, review of test results, and coordination of care.      Truitt Merle, MD 03/24/17 8:35 PM   This document serves as a record of services personally performed by Truitt Merle, MD. It was created on her behalf by Theresia Bough, a trained  medical scribe. The creation of this record is based on the scribe's personal observations and the provider's statements to them.   I have reviewed the above documentation for accuracy and completeness, and I agree with the above.

## 2017-03-25 ENCOUNTER — Telehealth: Payer: Self-pay | Admitting: Hematology

## 2017-03-25 NOTE — Telephone Encounter (Signed)
No 1/16 los.

## 2017-03-31 ENCOUNTER — Other Ambulatory Visit: Payer: BC Managed Care – PPO

## 2017-03-31 ENCOUNTER — Ambulatory Visit: Payer: BC Managed Care – PPO

## 2017-03-31 ENCOUNTER — Ambulatory Visit: Payer: BC Managed Care – PPO | Admitting: Hematology

## 2017-04-01 ENCOUNTER — Telehealth: Payer: Self-pay | Admitting: Hematology

## 2017-04-01 NOTE — Telephone Encounter (Signed)
04/01/17 @ 8:53 am spoke with patient's niece and confirmed FMLA has been successfully faxed to 03/19/17 @ 8:04 am

## 2017-04-05 ENCOUNTER — Other Ambulatory Visit: Payer: Self-pay | Admitting: Hematology

## 2017-05-07 DEATH — deceased

## 2017-05-20 ENCOUNTER — Other Ambulatory Visit: Payer: Self-pay | Admitting: Nurse Practitioner

## 2018-03-01 IMAGING — CT CT CHEST W/ CM
2 of 3 series · 15 of 36 positions shown, 18 images · IV contrast (ISOVUE 300)
Comparison: CT and MRI examinations from 02/15/2017

CLINICAL DATA: Newly diagnosed pancreatic cancer, staging
assessment. Weight loss.

EXAM:
CT CHEST WITH CONTRAST
TECHNIQUE: Multidetector CT imaging of the chest was performed during
intravenous contrast administration.
CONTRAST:  80mL TCX2UG-ZMM IOPAMIDOL (TCX2UG-ZMM) INJECTION 61%

[Series 2: thorax · axial · 0.68mm/px · z∈[-332,-44]mm · 12 of 170 slices shown, 15 images]
[im 13/170  mediastinal]
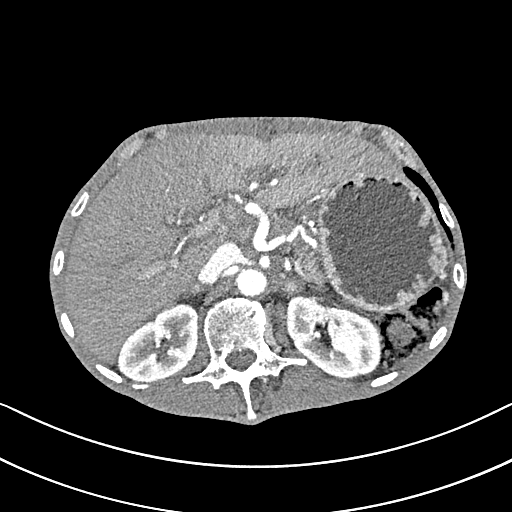
[im 13/170  lung]
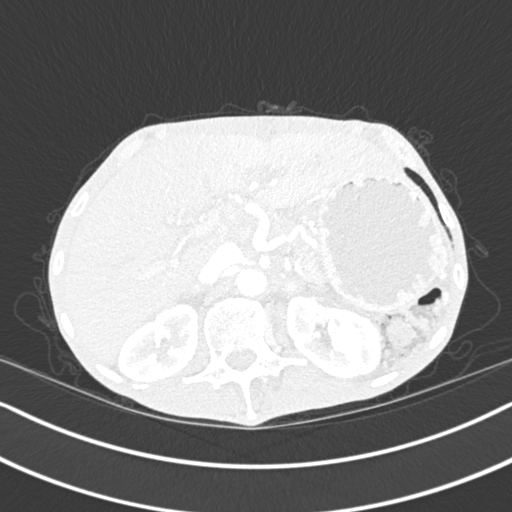
[im 26/170  lung]
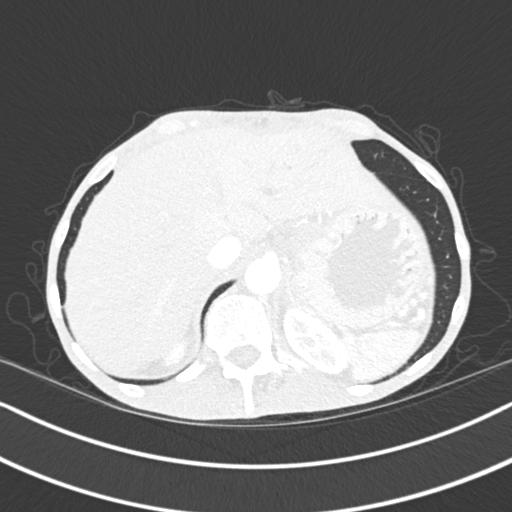
[im 38/170  lung]
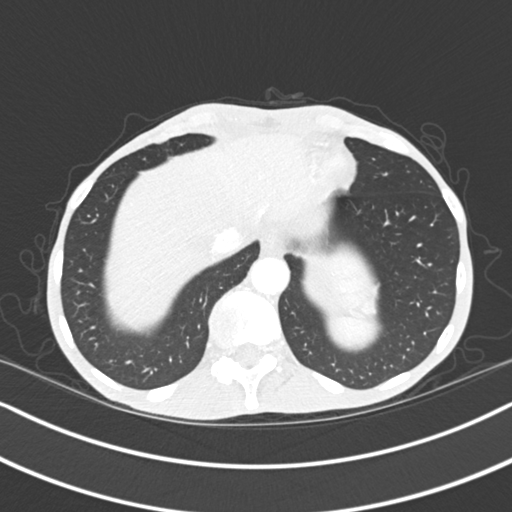
[im 51/170  lung]
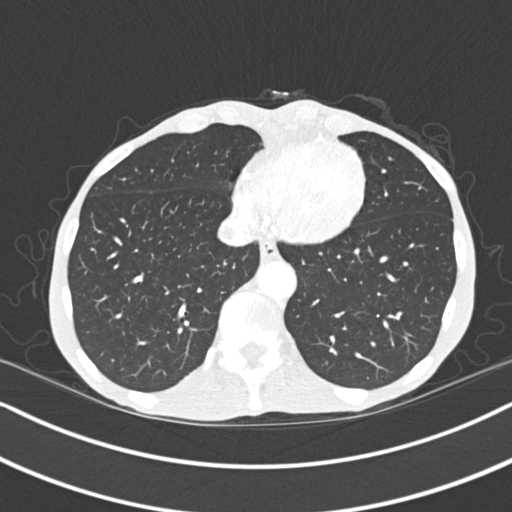
[im 63/170  mediastinal]
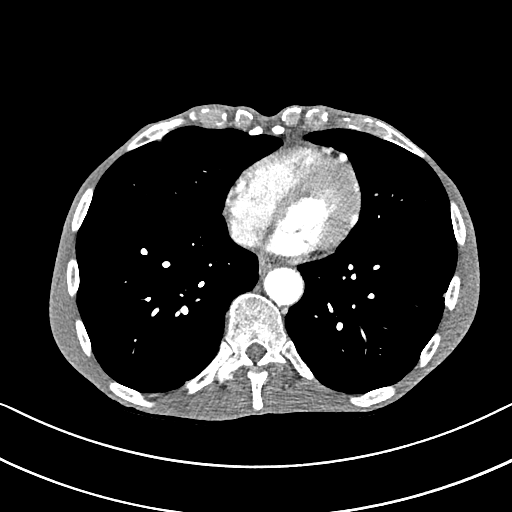
[im 63/170  lung]
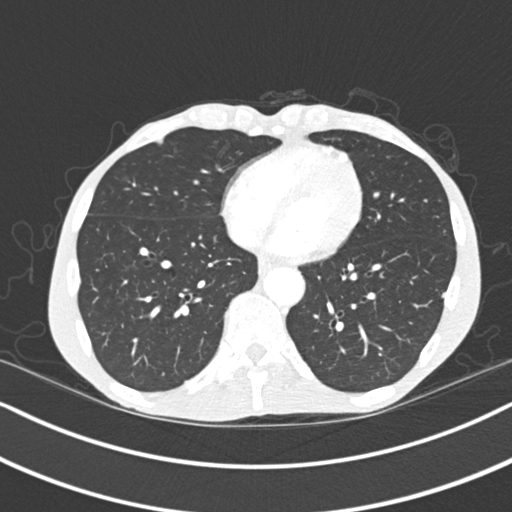
[im 76/170  lung]
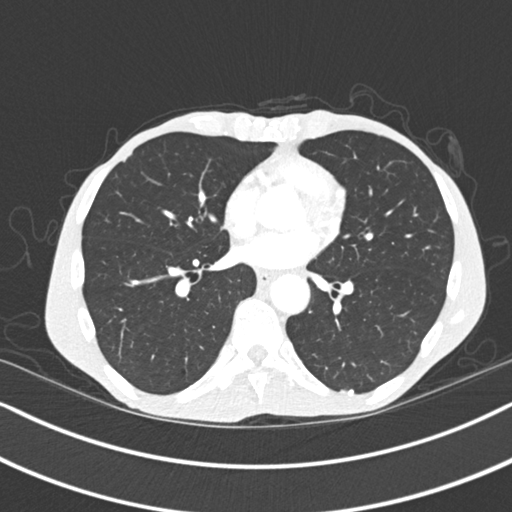
[im 94/170  lung]
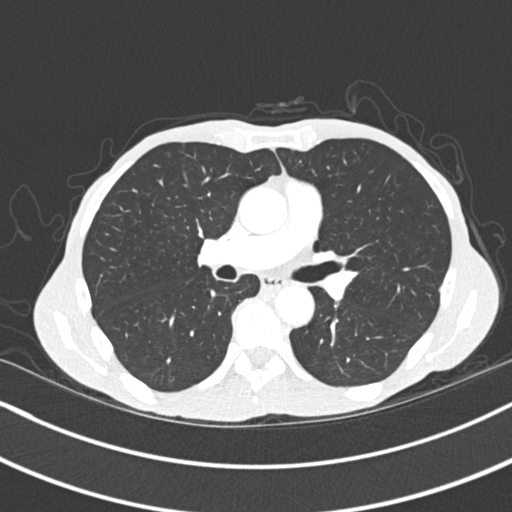
[im 107/170  lung]
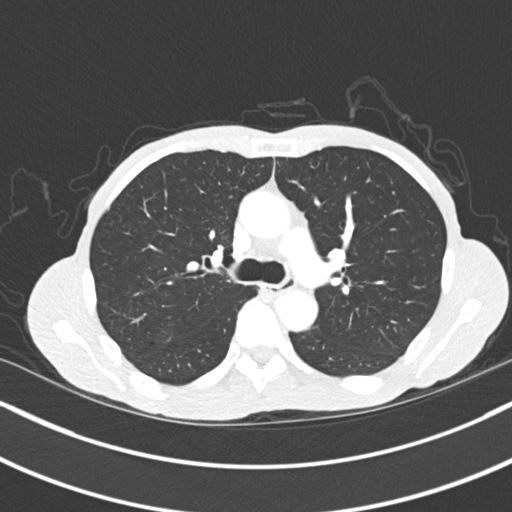
[im 119/170  mediastinal]
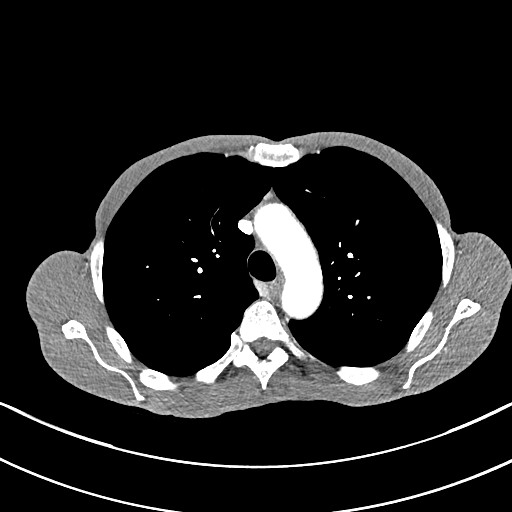
[im 119/170  lung]
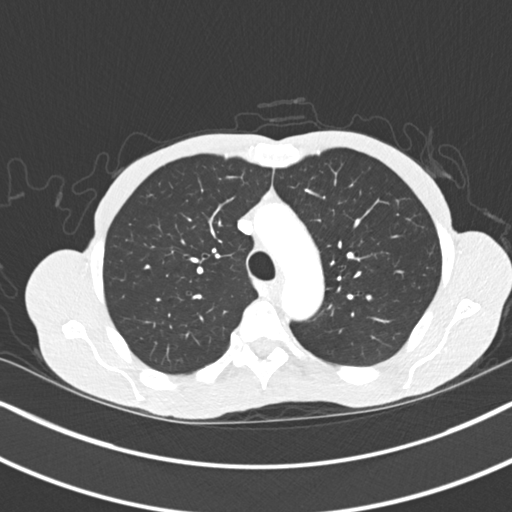
[im 132/170  lung]
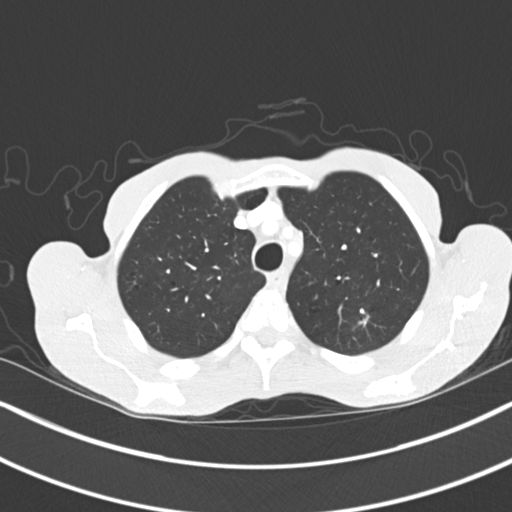
[im 144/170  lung]
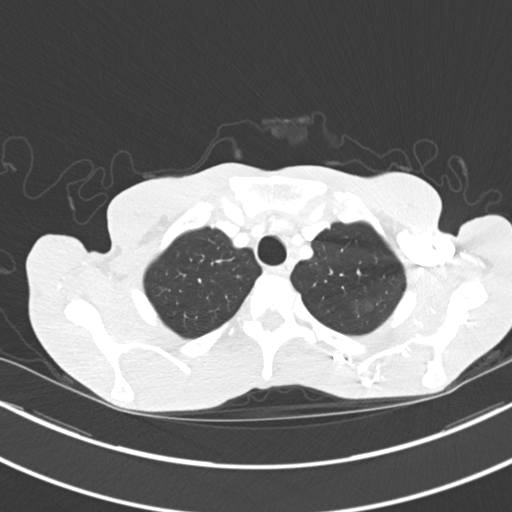
[im 157/170  lung]
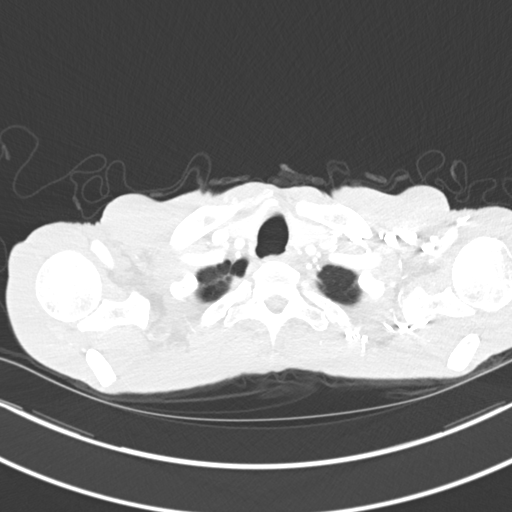

[Series 5: coronal · coronal · 0.62mm/px · 3 of 105 slices shown]
[im 21/105  lung]
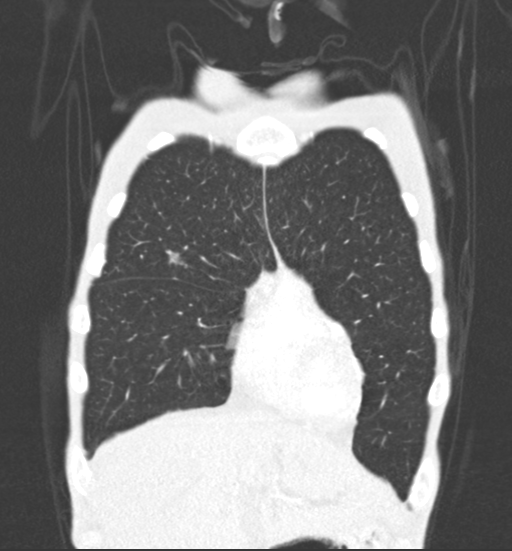
[im 42/105  lung]
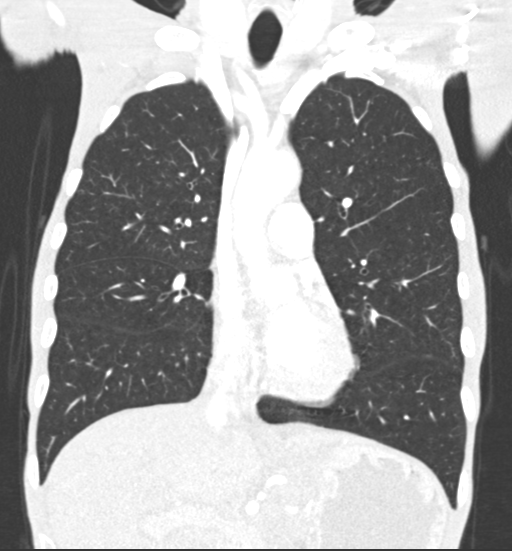
[im 63/105  lung]
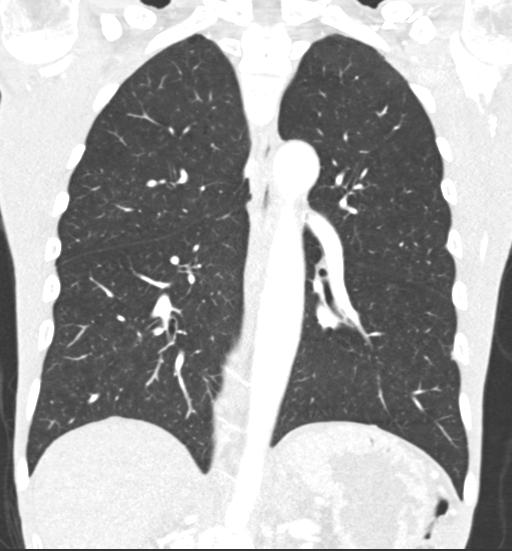

[15 of 36 positions shown; findings below may reference images not displayed]

FINDINGS: Cardiovascular: Atherosclerotic calcification of the aortic arch.

Mediastinum/Nodes: No pathologic mediastinal adenopathy.

Lungs/Pleura: Solid 1.3 by 1.1 cm right upper lobe nodule with
mildly spiculated borders, image 82/3.

Part solid right middle lobe pulmonary nodule measuring 1.0 by
cm on image 106/3.

Sub solid right upper lobe pulmonary nodule 1.3 by 1.9 cm on image
60/3.

Sub solid left upper lobe pulmonary nodule measuring 3.0 by 1.8 cm
on image 32/3. This has a confluent more solid portion inferiorly
measuring 1.0 by 0.7 cm, image 36/3.

Scattered pleural plaques are present bilaterally, favoring remote
asbestos exposure. Some of these are calcified. No pleural effusion.

Upper Abdomen: Pancreatic mass with dilated dorsal pancreatic duct
in the pancreatic tail, as noted on recent prior dedicated abdominal
imaging exams. Gastric varices reconstitute the splenic vein. Left
hepatic duct obstruction due to adenopathy in the porta hepatis.
Indistinct fat planes in the porta hepatis along with periportal
edema. Indistinct potential adenopathy in the right gastric chain.

Musculoskeletal: No findings of osseous metastatic disease. We
partially image the known deformity at the L2 vertebral level with
bridging spurring at L1-2.
IMPRESSION: 1. Bilateral subsolid and partially solid nodules in addition to a
solid 1.3 by 1.1 cm right upper lobe nodule, appearance suspicious
for malignancy. Given the spiculated and subsolid appearance of some
of these nodules, I cannot exclude a second primary lung malignancy
in addition to the known pancreatic lesion.
2. Known pancreatic mass with stable findings in the upper abdomen.
3. Calcified pleural plaques compatible with remote asbestos
exposure.
4.  Aortic Atherosclerosis ([id number]).

## 2018-03-25 IMAGING — XA IR INT-EXT BILIARY DRAIN W/ CHOLANGIOGRAM
1 series · 12 of 17 positions shown · non-contrast
Comparison: none

INDICATION: 66-year-old male with pancreatic cancer and obstructed jaundice. He
presents for percutaneous transhepatic cholangiography and biliary
drain placement.

[Series 300: ir biliary drain placement with cholangi · 12 of 17 slices shown]
[im 1/17]
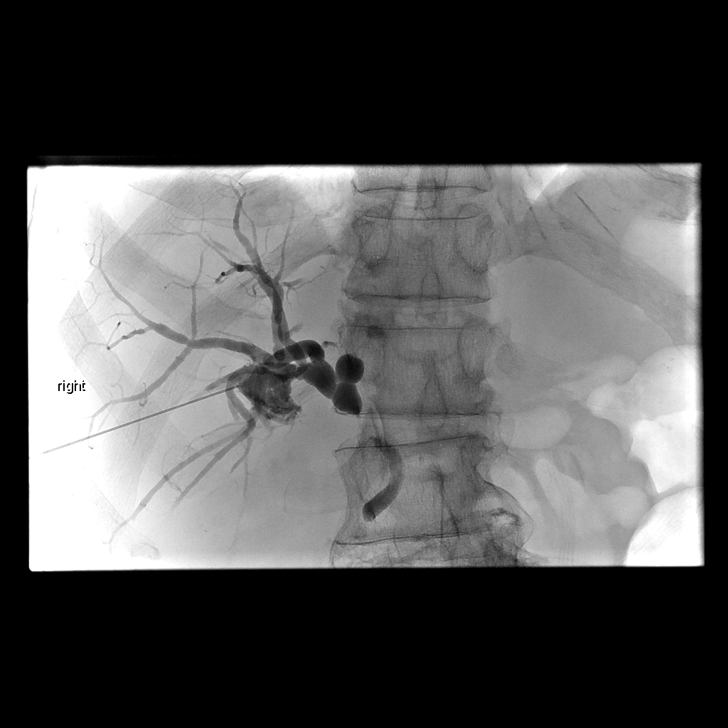
[im 3/17]
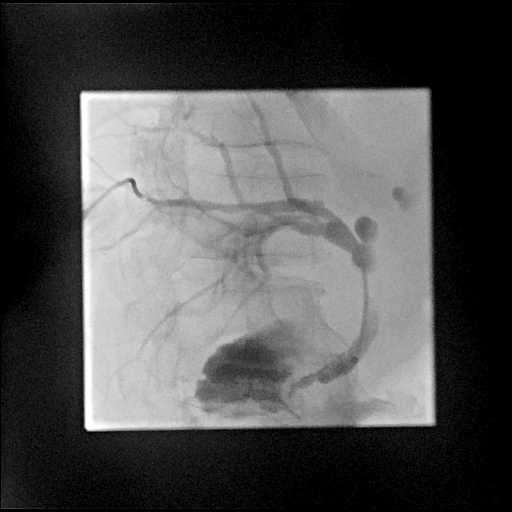
[im 4/17]
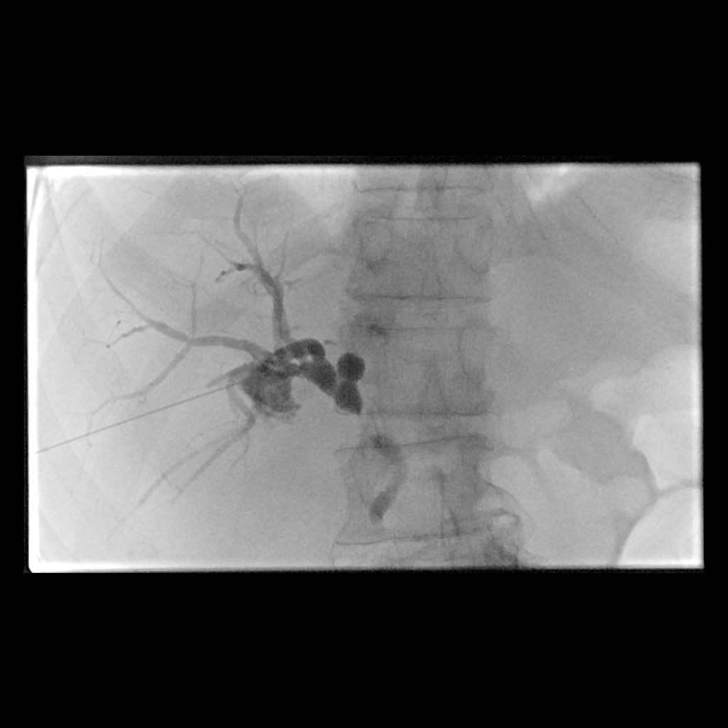
[im 5/17]
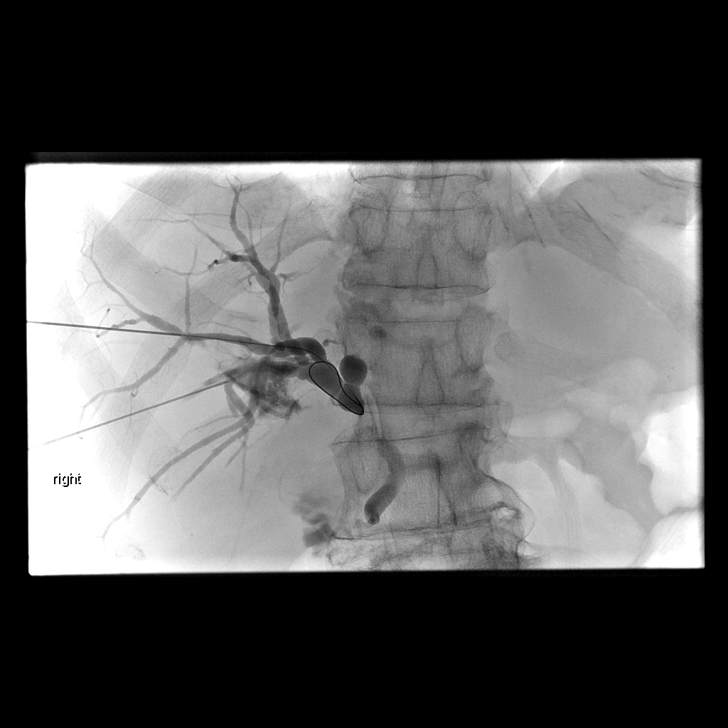
[im 7/17]
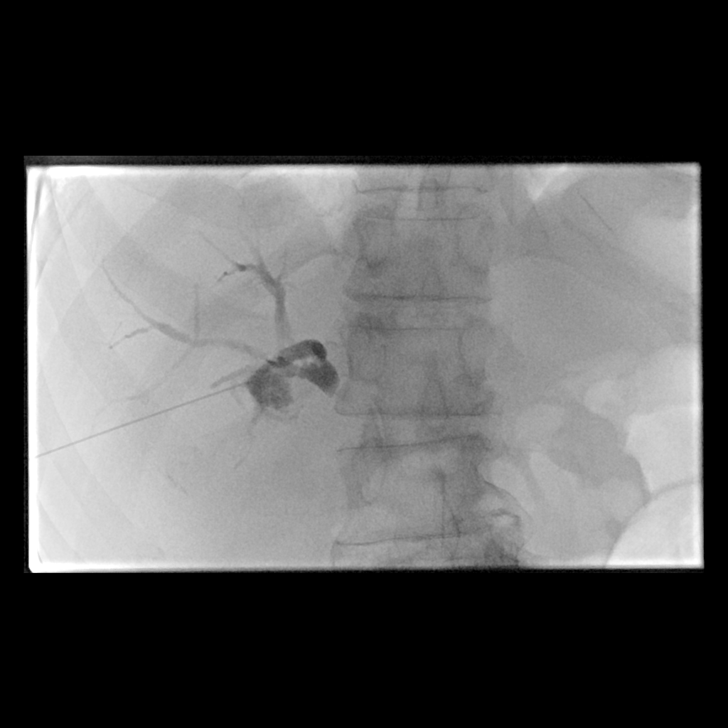
[im 8/17]
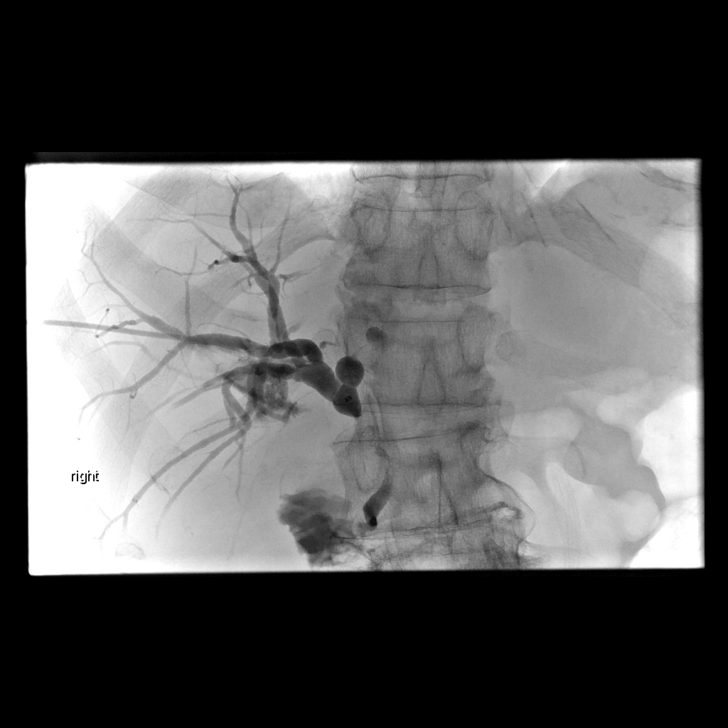
[im 10/17]
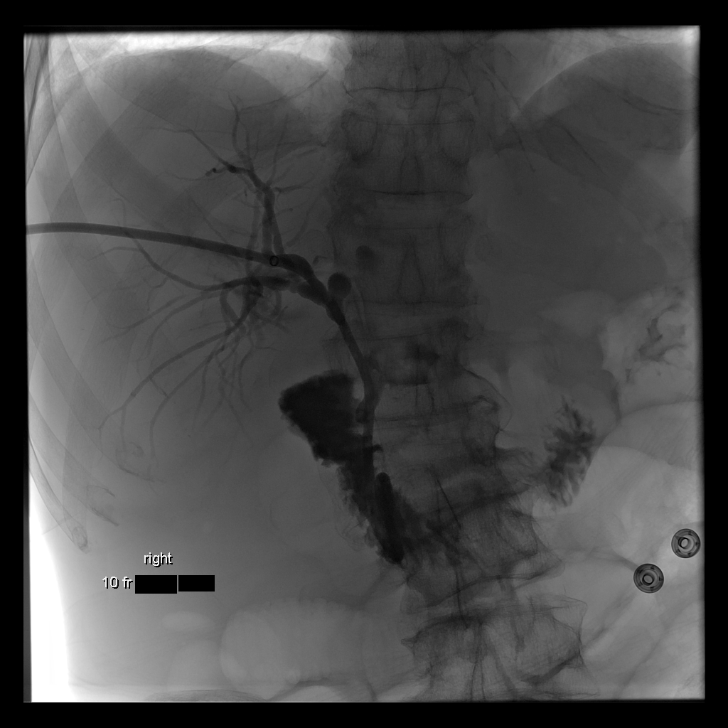
[im 11/17]
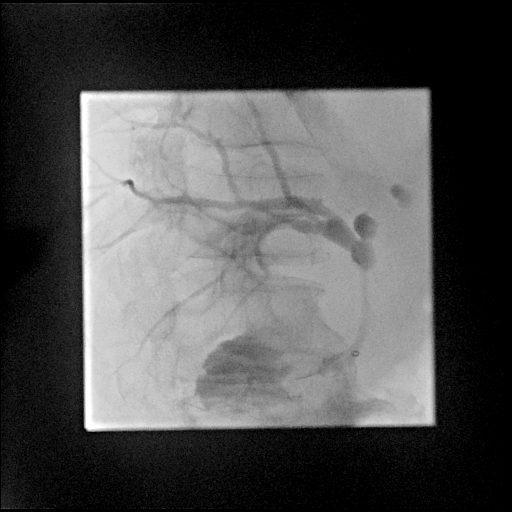
[im 13/17]
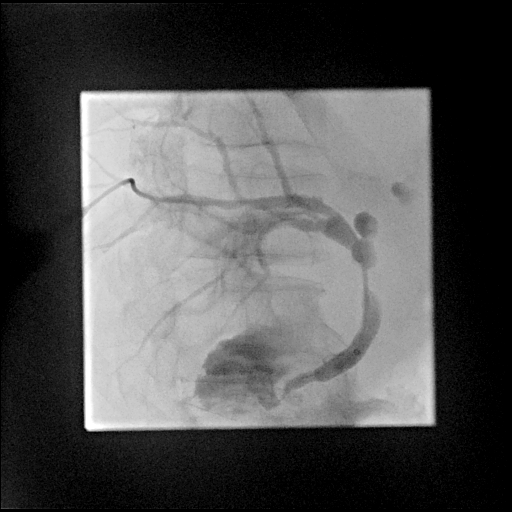
[im 14/17]
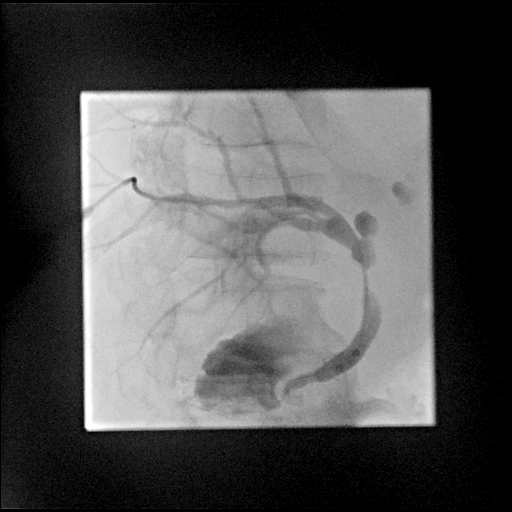
[im 15/17]
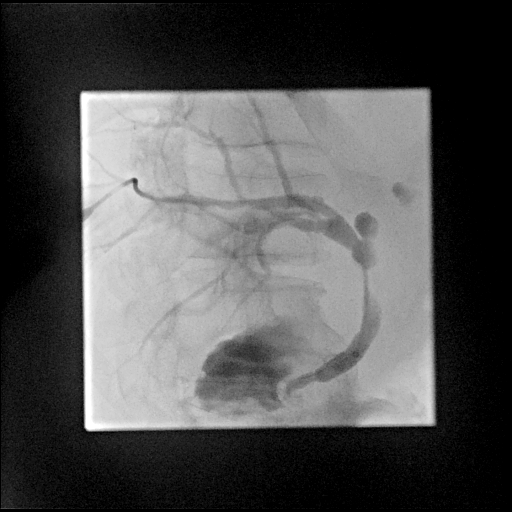
[im 17/17]
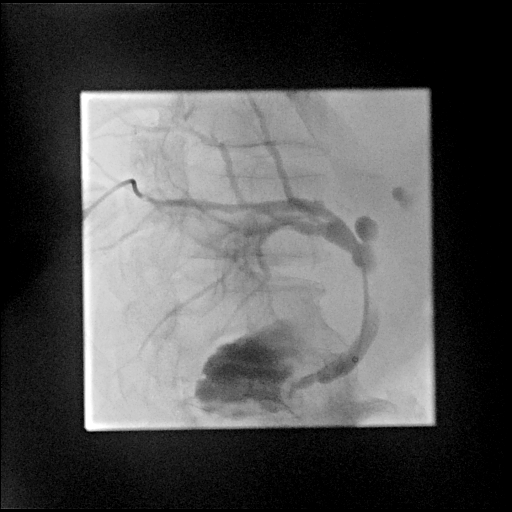

[12 of 17 positions shown; findings below may reference images not displayed]

EXAM:
Percutaneous transhepatic cholangiography and placement of an
internal/external percutaneous biliary drain.

MEDICATIONS:
3.375 g Zosyn; The antibiotic was administered within an appropriate
time frame prior to the initiation of the procedure.

ANESTHESIA/SEDATION:
Moderate (conscious) sedation was employed during this procedure. A
total of Versed 1 mg and Fentanyl 100 mcg was administered
intravenously.

Moderate Sedation Time: 25 minutes. The patient's level of
consciousness and vital signs were monitored continuously by
radiology nursing throughout the procedure under my direct
supervision.

FLUOROSCOPY TIME:  Fluoroscopy Time: 7 minutes 30 seconds (42 mGy).

COMPLICATIONS:
None immediate.

PROCEDURE:
Informed written consent was obtained from the patient after a
thorough discussion of the procedural risks, benefits and
alternatives. All questions were addressed. Maximal Sterile Barrier
Technique was utilized including caps, mask, sterile gowns, sterile
gloves, sterile drape, hand hygiene and skin antiseptic. A timeout
was performed prior to the initiation of the procedure.

Under fluoroscopic guidance, [place] catheter was advanced
intercostal at the anterior axillary line into the liver. As the
needle was pulled back, a gentle hand injection of contrast material
was performed. There was near immediate opacification of the biliary
tree. A percutaneous transhepatic cholangiogram was performed. There
is a short segment focal occlusion of the common hepatic duct.
Additionally, the left hepatic duct is critically stenosed. There is
minimal contrast entering the left biliary system.

A suitable tertiary radical from the right posterior ductal system
was identified as an access point. A 21 gauge Accustick needle was
then carefully used to puncture the tertiary radicle. A wire was
advanced into the central right hepatic ducts. The needle was
exchanged over the wire for the Accustick sheath. The Accustick
sheath was advanced into the proximal common hepatic duct just above
the occlusion. A road runner wire was successfully navigated across
the occlusion and into the distal common bile duct. The Accustick
sheath was advanced into the distal common bile duct. Contrast
injection confirms patency of the ampulla. The roadrunner wire was
advanced through the ampulla and into the duodenum. A 4 French glide
cath was advanced over the wire into the duodenum. The roadrunner
wire was then exchanged for a superstiff Amplatz wire. The glide
catheter and Accustick sheath were removed. The skin tract was
dilated to 10 French. Gunner Beaudoin 10.2 French biliary drainage catheter
was then advanced over the wire and formed within the duodenum. The
catheter was flushed. Excellent patency. There is already
decompression of the biliary tree. The catheter was then flushed
with saline and secured to the skin with 0 Prolene suture.

A gravity bag was placed.  The patient tolerated the procedure well.
IMPRESSION: 1. Cholangiogram demonstrates short segment complete occlusion of
the common hepatic duct and critical stenosis versus occlusion of
the left hepatic duct. Minimal contrast enters the left biliary tree
from the right.
2. Successful placement of a 10 French internal/external
percutaneous biliary drainage catheter.
